# Patient Record
Sex: Female | Born: 1990 | Race: White | Hispanic: No | Marital: Married | Smoking: Never smoker
Health system: Southern US, Community
[De-identification: ages and names within clinical notes are randomized; demographics above are authoritative.]

## PROBLEM LIST (undated history)

## (undated) DIAGNOSIS — F419 Anxiety disorder, unspecified: Secondary | ICD-10-CM

## (undated) DIAGNOSIS — K5010 Crohn's disease of large intestine without complications: Secondary | ICD-10-CM

## (undated) DIAGNOSIS — K603 Anal fistula, unspecified: Secondary | ICD-10-CM

## (undated) DIAGNOSIS — K601 Chronic anal fissure: Secondary | ICD-10-CM

## (undated) DIAGNOSIS — Z973 Presence of spectacles and contact lenses: Secondary | ICD-10-CM

## (undated) HISTORY — PX: COLONOSCOPY: SHX174

## (undated) HISTORY — PX: WISDOM TOOTH EXTRACTION: SHX21

## (undated) HISTORY — PX: ANAL FISSURE REPAIR: SHX2312

## (undated) HISTORY — DX: Crohn's disease of large intestine without complications: K50.10

## (undated) HISTORY — DX: Anal fistula: K60.3

## (undated) HISTORY — DX: Anal fistula, unspecified: K60.30

---
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
## 2008-04-19 ENCOUNTER — Encounter: Admission: RE | Admit: 2008-04-19 | Discharge: 2008-04-19 | Payer: Self-pay | Admitting: Obstetrics and Gynecology

## 2008-10-16 ENCOUNTER — Encounter: Admission: RE | Admit: 2008-10-16 | Discharge: 2008-10-16 | Payer: Self-pay | Admitting: Obstetrics and Gynecology

## 2009-04-10 ENCOUNTER — Encounter: Admission: RE | Admit: 2009-04-10 | Discharge: 2009-04-10 | Payer: Self-pay | Admitting: Obstetrics and Gynecology

## 2009-10-03 ENCOUNTER — Encounter: Admission: RE | Admit: 2009-10-03 | Discharge: 2009-10-03 | Payer: Self-pay | Admitting: Obstetrics and Gynecology

## 2009-11-16 ENCOUNTER — Other Ambulatory Visit: Admission: RE | Admit: 2009-11-16 | Discharge: 2009-11-16 | Payer: Self-pay | Admitting: General Surgery

## 2010-05-19 IMAGING — US US BREAST BILAT
1 series · 13 of 25 positions shown · non-contrast
Comparison: 04/10/2009;

CLINICAL DATA: Follow-up of multiple fibroadenomas, one on the
left and two on the right

BILATERAL BREAST ULTRASOUND

[Series 1: us breast bilat · 13 of 30 slices shown]
[im 1/30]
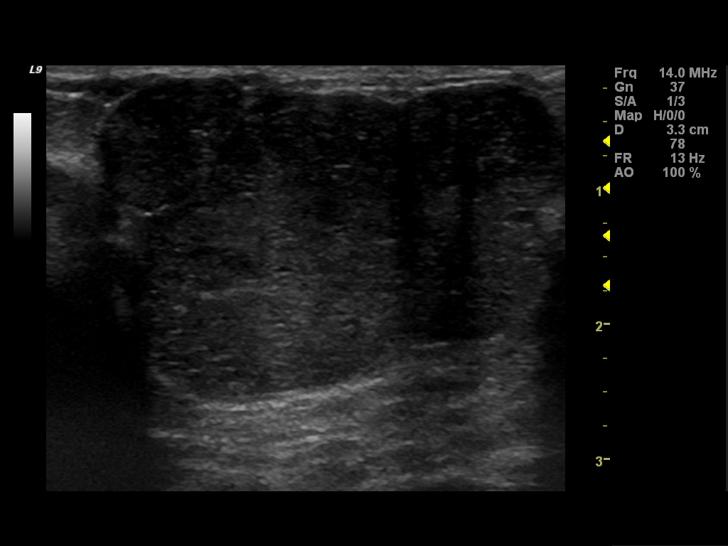
[im 3/30]
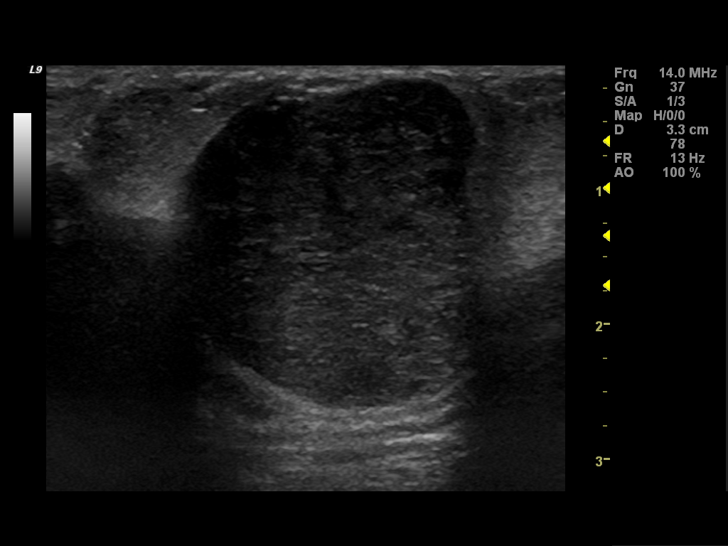
[im 5/30]
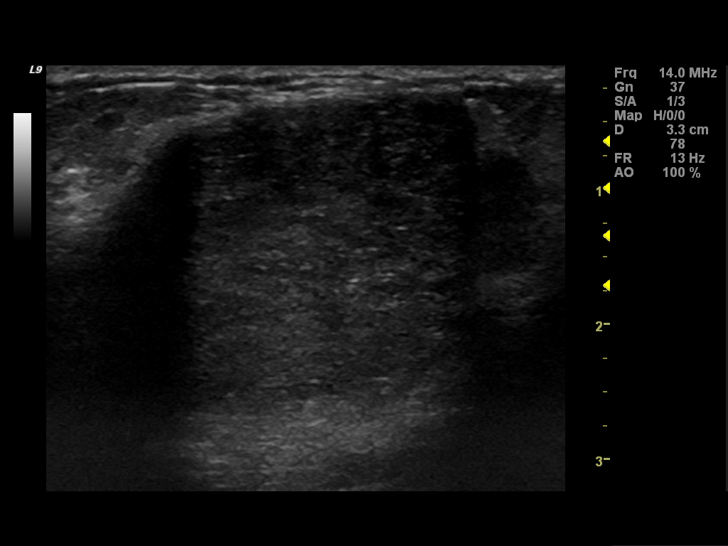
[im 8/30]
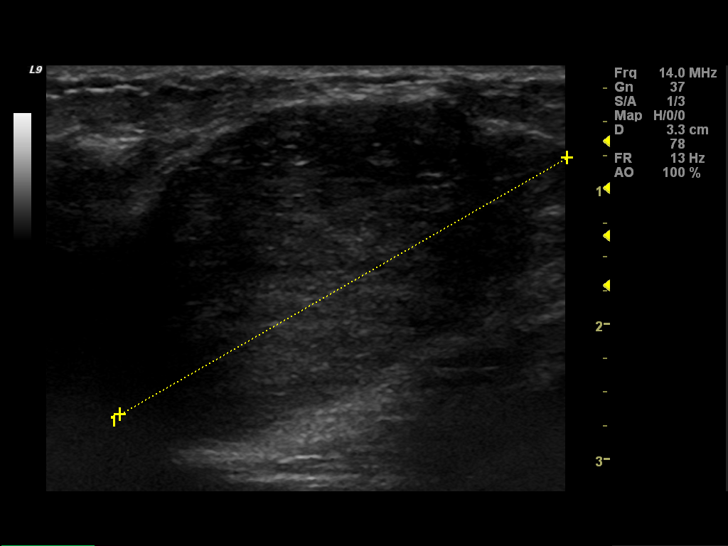
[im 10/30]
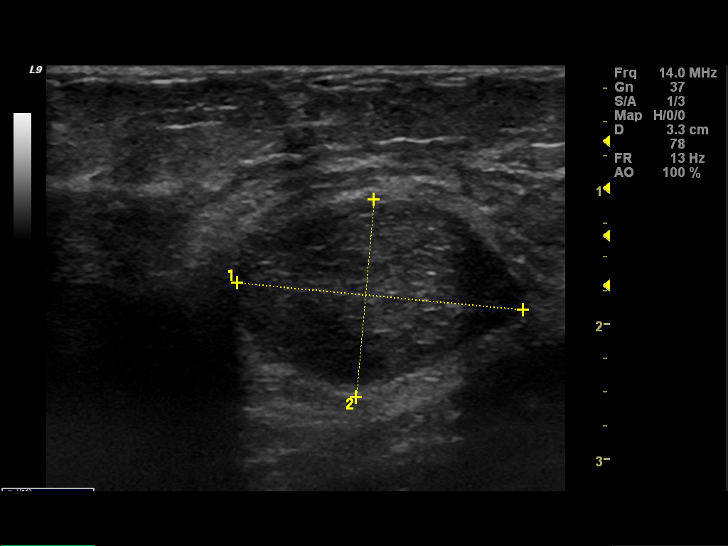
[im 13/30]
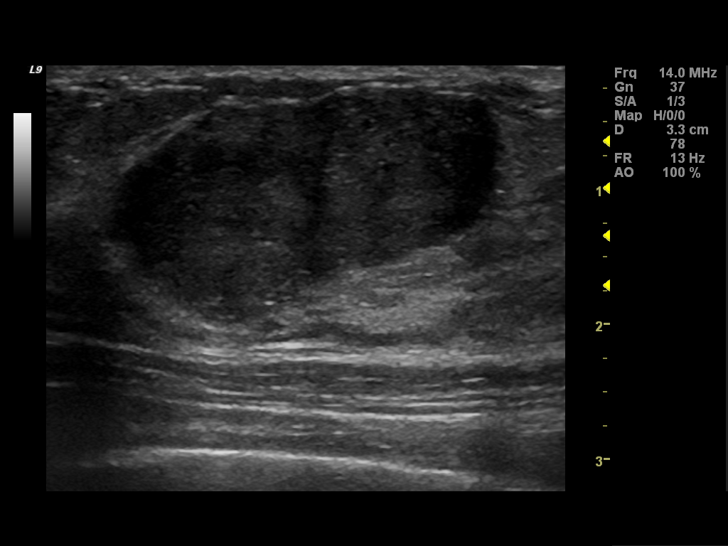
[im 15/30]
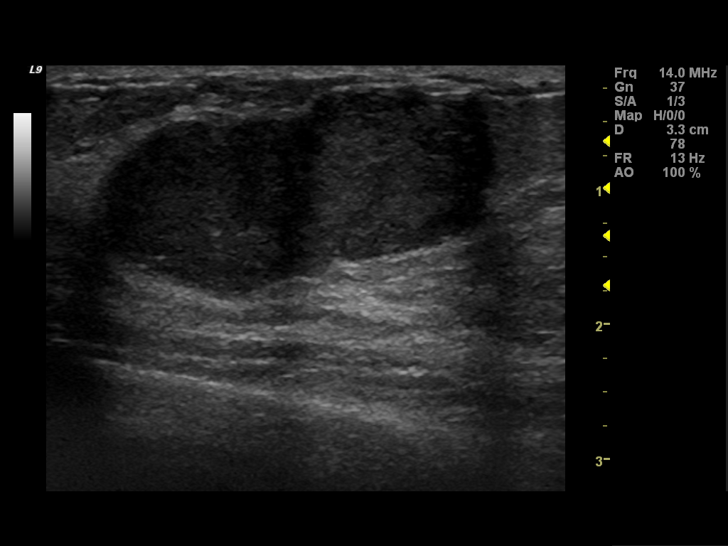
[im 17/30]
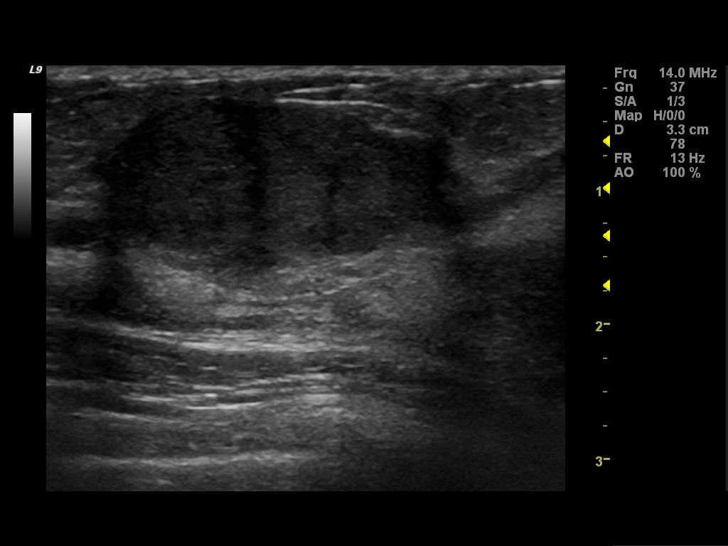
[im 20/30]
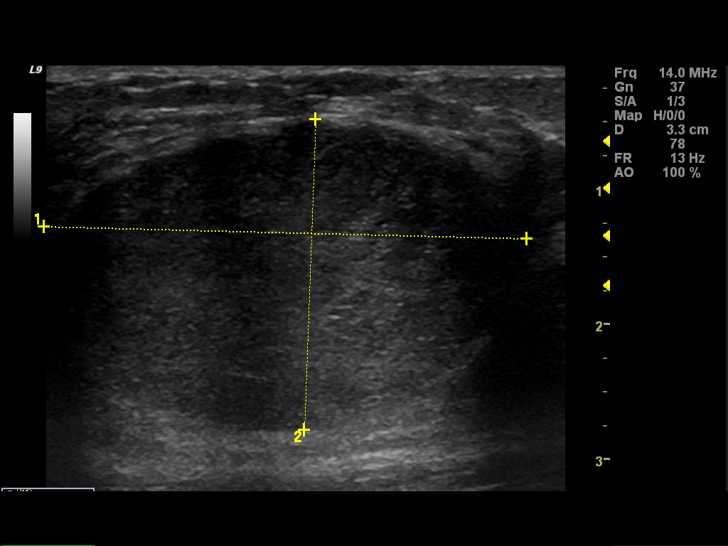
[im 22/30]
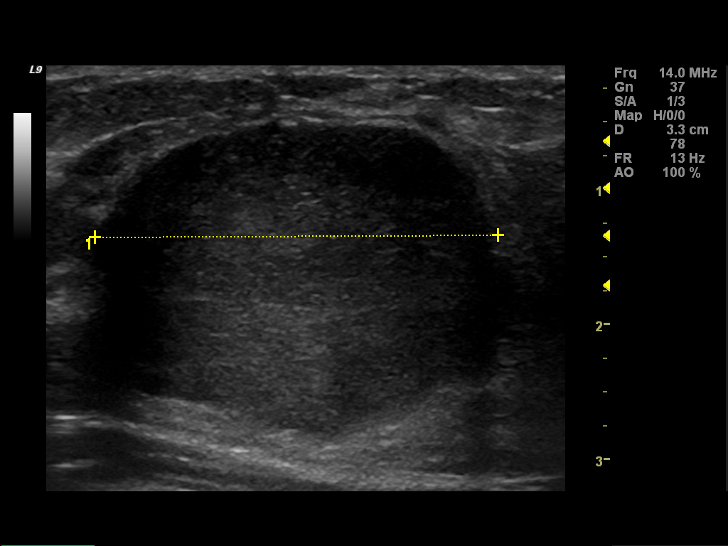
[im 25/30]
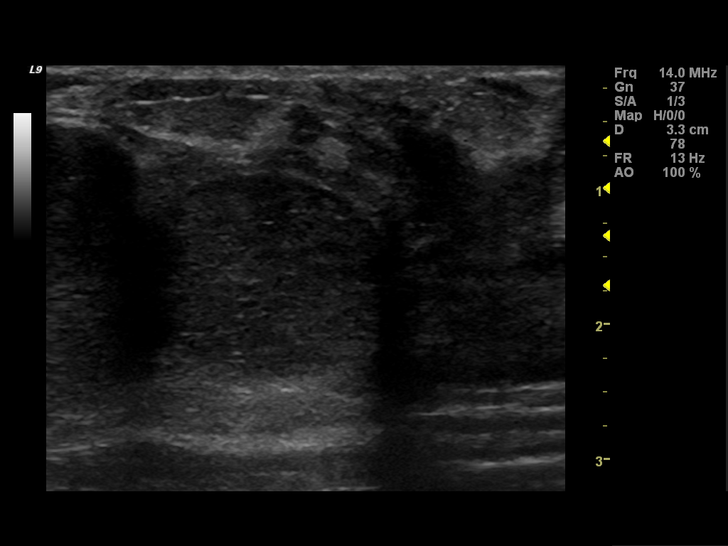
[im 27/30]
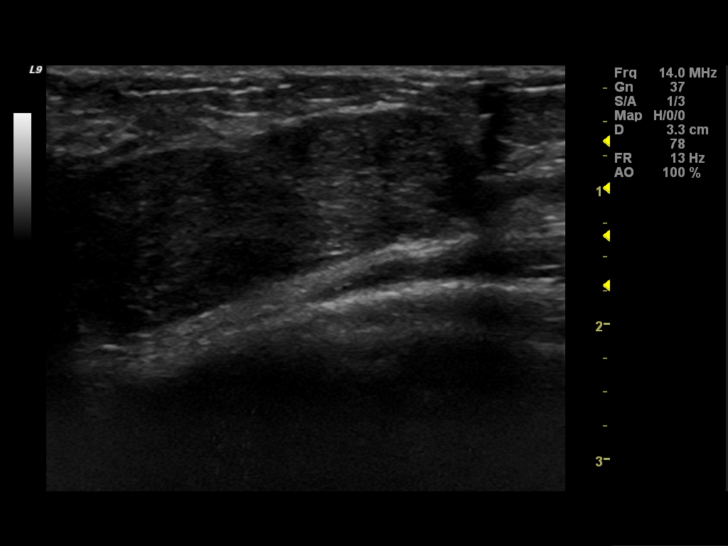
[im 30/30]
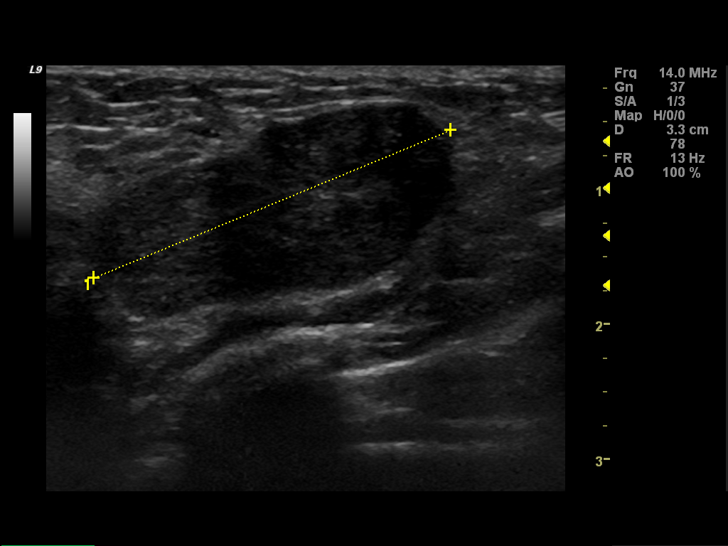

[13 of 25 positions shown; findings below may reference images not displayed]

10/16/2008; 04/19/2008; 10/29/2007

On physical exam, all of the fibroadenomas which will be described
below are palpable and mobile.
FINDINGS: Ultrasound is performed, showing numerous oval
circumscribed masses bilaterally, some of which are new, and some
of which have been previously described.  Those which have been
previously described have again enlarged to varying degrees, as
they did in the interval between the last ultrasound study and the
study immediately prior to that.  The comparisons described in
further detail below are between measurements obtained on today's
study and those obtained during the most recent comparison study on
04/10/09.

On the left, there are three new masses indicated by the patient,
as follows:

[DATE], 7cm from the nipple:  31 x 14 x 26mm

[DATE], 4cm from the nipple:  18 x 14 x 16mm

[DATE], 7cm from the nipple:   32 x 11 x 29mm

In the left [DATE] position, 5cm from the nipple, the mass has
increased from 26 x 20 x 29mm to 36 x 23 x 30mm

On the right, there is a new mass in the [DATE] position, 4cm from
the nipple, measuring 21 x 15 x 33mm.

In the right [DATE] position 5cm from the nipple, the mass has
mildly enlarged from 24 x 19 x 33mm to 22 x 25 x 35mm.  In the
right [DATE] position, 6cm from the nipple, the previously biopsy-
proven fibroadenoma has enlarged from 33x 20 x 29mm to 40 x 23 x
38mm.
IMPRESSION: Numerous bilateral masses, 7 in total, 4 of which are new, as
indicated above.  The remaining 3 masses, one of which has been
biopsied and shown to represent a fibroadenoma, have all again
enlarged, particularly the previously biopsied mass.

BI-RADS CATEGORY 4:  Suspicious abnormality - biopsy should be
considered.

RECOMMENDATION:

The three masses which have again enlarged should be surgically
excised.  The four new masses should be followed by ultrasound in
six months.  The patient left the [REDACTED] today with an
appointment to see Dr. Burchell on [DATE] at 4pm.

## 2010-06-18 ENCOUNTER — Encounter
Admission: RE | Admit: 2010-06-18 | Discharge: 2010-06-18 | Payer: Self-pay | Source: Home / Self Care | Attending: Obstetrics and Gynecology | Admitting: Obstetrics and Gynecology

## 2010-06-23 ENCOUNTER — Encounter (HOSPITAL_COMMUNITY): Payer: Self-pay | Admitting: Obstetrics and Gynecology

## 2010-06-24 ENCOUNTER — Encounter (HOSPITAL_COMMUNITY): Payer: Self-pay | Admitting: Obstetrics and Gynecology

## 2012-06-16 ENCOUNTER — Other Ambulatory Visit: Payer: Self-pay | Admitting: Obstetrics and Gynecology

## 2012-06-16 DIAGNOSIS — D249 Benign neoplasm of unspecified breast: Secondary | ICD-10-CM

## 2012-06-18 ENCOUNTER — Other Ambulatory Visit: Payer: Self-pay

## 2012-06-22 ENCOUNTER — Ambulatory Visit
Admission: RE | Admit: 2012-06-22 | Discharge: 2012-06-22 | Disposition: A | Discharge status: Home / Self Care | Payer: 59 | Source: Ambulatory Visit | Attending: Obstetrics and Gynecology | Admitting: Obstetrics and Gynecology

## 2012-06-22 DIAGNOSIS — D249 Benign neoplasm of unspecified breast: Secondary | ICD-10-CM

## 2012-11-24 ENCOUNTER — Other Ambulatory Visit: Payer: Self-pay | Admitting: Obstetrics and Gynecology

## 2012-11-24 DIAGNOSIS — D249 Benign neoplasm of unspecified breast: Secondary | ICD-10-CM

## 2012-12-24 ENCOUNTER — Ambulatory Visit
Admission: RE | Admit: 2012-12-24 | Discharge: 2012-12-24 | Disposition: A | Discharge status: Home / Self Care | Payer: 59 | Source: Ambulatory Visit | Attending: Obstetrics and Gynecology | Admitting: Obstetrics and Gynecology

## 2012-12-24 DIAGNOSIS — D249 Benign neoplasm of unspecified breast: Secondary | ICD-10-CM

## 2013-08-19 ENCOUNTER — Other Ambulatory Visit: Payer: Self-pay | Admitting: Obstetrics and Gynecology

## 2013-08-19 ENCOUNTER — Ambulatory Visit
Admission: RE | Admit: 2013-08-19 | Discharge: 2013-08-19 | Disposition: A | Discharge status: Home / Self Care | Payer: 59 | Source: Ambulatory Visit | Attending: Obstetrics and Gynecology | Admitting: Obstetrics and Gynecology

## 2013-08-19 DIAGNOSIS — N6323 Unspecified lump in the left breast, lower outer quadrant: Secondary | ICD-10-CM

## 2013-08-19 DIAGNOSIS — N6320 Unspecified lump in the left breast, unspecified quadrant: Principal | ICD-10-CM

## 2013-08-19 DIAGNOSIS — N6325 Unspecified lump in the left breast, overlapping quadrants: Secondary | ICD-10-CM

## 2013-09-14 ENCOUNTER — Ambulatory Visit
Admission: RE | Admit: 2013-09-14 | Discharge: 2013-09-14 | Disposition: A | Discharge status: Home / Self Care | Payer: 59 | Source: Ambulatory Visit | Attending: Obstetrics and Gynecology | Admitting: Obstetrics and Gynecology

## 2013-09-14 ENCOUNTER — Other Ambulatory Visit: Payer: Self-pay | Admitting: Obstetrics and Gynecology

## 2013-09-14 DIAGNOSIS — N6320 Unspecified lump in the left breast, unspecified quadrant: Secondary | ICD-10-CM

## 2013-09-30 ENCOUNTER — Ambulatory Visit (INDEPENDENT_AMBULATORY_CARE_PROVIDER_SITE_OTHER): Payer: 59 | Admitting: Surgery

## 2016-06-13 ENCOUNTER — Other Ambulatory Visit: Payer: Self-pay

## 2016-06-13 DIAGNOSIS — D229 Melanocytic nevi, unspecified: Secondary | ICD-10-CM

## 2016-06-13 HISTORY — DX: Melanocytic nevi, unspecified: D22.9

## 2017-01-08 ENCOUNTER — Other Ambulatory Visit: Payer: Self-pay | Admitting: Gastroenterology

## 2017-01-08 DIAGNOSIS — R748 Abnormal levels of other serum enzymes: Secondary | ICD-10-CM

## 2017-01-12 ENCOUNTER — Other Ambulatory Visit: Payer: Self-pay

## 2017-01-12 ENCOUNTER — Ambulatory Visit
Admission: RE | Admit: 2017-01-12 | Discharge: 2017-01-12 | Disposition: A | Discharge status: Home / Self Care | Payer: Managed Care, Other (non HMO) | Source: Ambulatory Visit | Attending: Gastroenterology | Admitting: Gastroenterology

## 2017-01-12 DIAGNOSIS — R748 Abnormal levels of other serum enzymes: Secondary | ICD-10-CM

## 2017-01-12 MED ORDER — IOPAMIDOL (ISOVUE-300) INJECTION 61%
100.0000 mL | Freq: Once | INTRAVENOUS | Status: AC | PRN
  Administered 2017-01-12: 100 mL via INTRAVENOUS

## 2017-01-13 ENCOUNTER — Other Ambulatory Visit: Payer: Self-pay

## 2019-06-03 DIAGNOSIS — U071 COVID-19: Secondary | ICD-10-CM

## 2019-06-03 HISTORY — DX: COVID-19: U07.1

## 2019-08-23 ENCOUNTER — Telehealth: Payer: Self-pay

## 2019-08-23 NOTE — Telephone Encounter (Signed)
Phone call to patient per [facility] request to schedule patient a follow up appointment.  Voicemail left for patient to return our call.

## 2019-08-25 ENCOUNTER — Telehealth: Payer: Self-pay | Admitting: Physician Assistant

## 2019-08-25 NOTE — Telephone Encounter (Signed)
Patient left message on voicemail last night.  She said that she was returning our phone call.  Chart # [id number]

## 2019-08-25 NOTE — Telephone Encounter (Signed)
Left message for patient to call the office and schedule an appointment for recheck, per KRS.

## 2019-08-29 NOTE — Telephone Encounter (Signed)
Patient returned call. I informed her that nurse left a message for her to call back and schedule a follow up with [facility]. Pt states that the lesion on her nose is getting better now and she does not feel that she needs follow up at this time. She will call back PRN.

## 2020-04-05 ENCOUNTER — Ambulatory Visit: Payer: Self-pay | Admitting: Surgery

## 2020-04-05 NOTE — H&P (Signed)
CC: Referred for possible anal fissure/pain  HPI: Ms. Melissa Nicholson is a very pleasant 84yoF with hx of anxiety who was referred to [place] by [facility] physicians for evaluation of possible anal fissure. She reports beginning in May, she began having a sharp knifelike sensation when she had bowel movements. This will resolve after a few minutes following the bowel movement. She saw her primary care and was prescribed a compound topical ointment that she is unsure of name. She reports she was told to apply this for 4 days. She applied for approximately a week and began to notice some improvement but then stopped. Back in May she did have some drops of bright red blood when she had a bowel movement. That has since resolved. She is not taking a fiber supplement. She reports that she has 1 bowel movement per day and it varies in consistency from soft to hard. She drinks less than 64 ounces of water. She spends approximately 5 minutes on the commode when she has a bowel movement.  INTERVAL HX She has been applying the topical nifedipine gel 4 times daily as prescribed. She noticed that things began to heal and would reopen. She is taking fiber twice a day. She is drinking 64 ounces of water per day. She is having soft easy to pass bowel movements without any difficulty. Despite this, continues to have knifelike sensations with bowel movements.  She denies any issues with incontinence to gas, liquid or solid stool.  No prior vaginal deliveries or hx of pelvic trauma; is interested in having children in the future though  PMH: Anxiety (well controlled with escitalopram)  PSH: Denies any prior anorectal procedures or surgeries  FHx: Paternal grandmother had colon cancer; maternal aunt had breast cancer. She denies any other family histories of malignancy. She denies any known family history of IBD, Crohn's, ulcerative colitis.  Social: Denies use of tobacco; social EtOH use; denies illicit drug use. 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  The patient is a 29 year old female.   Allergies Melissa Nicholson, Melissa Nicholson; 03/21/2020 3:54 PM) No Known Drug Allergies  [01/24/2020]: Allergies Reconciled   Medication History Melissa Nicholson, Melissa Nicholson; 03/21/2020 3:54 PM) Citalopram Hydrobromide (20MG  Tablet, Oral) Active. Lo Loestrin Fe (1 MG-10 MCG /10 MCG Tablet, Oral) Active. Medications Reconciled    Review of Systems Melissa Nicholson; 03/21/2020 4:03 PM) General Not Present- Appetite Loss, Chills, Fatigue, Fever, Night Sweats, Weight Gain and Weight Loss. Skin Not Present- Change in Wart/Mole, Dryness, Hives, Jaundice, New Lesions, Non-Healing Wounds, Rash and Ulcer. Respiratory Not Present- Bloody sputum, Chronic Cough, Difficulty Breathing, Snoring and Wheezing. Breast Not Present- Breast Mass, Breast Pain, Nipple Discharge and Skin Changes. Cardiovascular Not Present- Chest Pain, Difficulty Breathing Lying Down, Leg Cramps, Palpitations, Rapid Heart Rate, Shortness of Breath and Swelling of Extremities. Gastrointestinal Present- Bloody Stool (Previously, now resolved), Hemorrhoids and Rectal Pain. Not Present- Abdominal Pain, Bloating, Change in Bowel Habits, Chronic diarrhea, Constipation, Difficulty Swallowing, Excessive gas, Gets full quickly at meals, Indigestion, Nausea and Vomiting. Female Genitourinary Not Present- Frequency, Nocturia, Painful Urination, Pelvic Pain and Urgency. Musculoskeletal Not Present- Back Pain, Joint Pain, Joint Stiffness, Muscle Pain, Muscle Weakness and Swelling of Extremities. Neurological Not Present- Decreased Memory, Fainting, Headaches, Numbness, Seizures, Tingling, Tremor, Trouble walking and Weakness. Psychiatric Not Present- Anxiety, Bipolar, Change in Sleep Pattern, Depression, Fearful and Frequent crying. Endocrine Not Present- Cold  Intolerance, Excessive Hunger, Hair Changes, Heat Intolerance, Hot flashes and New Diabetes. Hematology Not Present- Blood Clots and Blood Thinners.  Vitals Melissa Nicholson; 03/21/2020 3:54 PM) 03/21/2020 3:54 PM Weight: 139.13 lb Height: 64in Body Surface Area: 1.68 m Body Mass Index: 23.88 kg/m  Temp.: 98.81F  Pulse: 87 (Regular)  P.OX: 98% (Room air) BP: 120/66(Sitting, Left Arm, Standard)       Physical Exam Melissa Nicholson; 03/21/2020 4:03 PM) The physical exam findings are as follows: Note: Constitutional: No acute distress; conversant; no deformities; wearing mask Eyes: Moist conjunctiva; no lid lag; anicteric sclerae; pupils equal and round Lungs: Normal respiratory effort CV: rrr; no pitting edema GI: Abdomen soft, nontender, nondistended; no palpable hepatosplenomegaly Anorectal: Midline shallow anal fissure persists; no tags/ext hemorrhoids; DRE/Anoscopy deferred due to anal fissure today MSK: Normal gait; no clubbing/cyanosis Psychiatric: Appropriate affect; alert and oriented 3  **A chaperone, Melissa Nicholson, was present for this encounter    Assessment & Plan Melissa Nicholson; 03/21/2020 4:21 PM) ANAL FISSURE (K60.2) Story: Ms. Melissa Nicholson is a very pleasant 38yoF with anal fissure - persists despite maximal medical management and topical CCBs Impression: -The anatomy and physiology of the anal canal was discussed at length with the patient. The pathophysiology of anal fissures has been discussed at length with associated pictures and illustrations. -We have reviewed options going forward - including further treatment with CCBs although unlikely to remedy given her now 2 month trial without improvement; injection of anal Botox and lateral internal sphincterotomy -She is interested in anal Botox which I think is a reasonable choice for her - particularly given her plans for children in the future -The planned procedure, material risks  (including, but not limited to, pain, bleeding, infection, scarring, need for blood transfusion, damage to anal sphincter, incontinence of gas and/or stool, need for additional procedures, recurrence, pneumonia, heart attack, stroke, death) benefits and alternatives to surgery were discussed at length. I noted a good probability that the procedure would help improve their symptoms. The patient's questions were answered to their satisfaction, they voiced understanding and elected to proceed with surgery. Additionally, we discussed typical postoperative expectations and the recovery process.  This patient encounter took 18 minutes today to perform the following: take history, perform exam, review outside records, interpret imaging, counsel the patient on their diagnosis and document encounter, findings & plan in the EHR  Signed by Melissa Roup, Nicholson (03/21/2020 4:21 PM)

## 2020-06-18 ENCOUNTER — Other Ambulatory Visit (HOSPITAL_COMMUNITY): Payer: Managed Care, Other (non HMO)

## 2020-06-19 ENCOUNTER — Encounter (HOSPITAL_BASED_OUTPATIENT_CLINIC_OR_DEPARTMENT_OTHER): Payer: Self-pay | Admitting: Surgery

## 2020-06-19 ENCOUNTER — Other Ambulatory Visit (HOSPITAL_COMMUNITY)
Admission: RE | Admit: 2020-06-19 | Discharge: 2020-06-19 | Disposition: A | Discharge status: Home / Self Care | Payer: Managed Care, Other (non HMO) | Source: Ambulatory Visit | Attending: Surgery | Admitting: Surgery

## 2020-06-19 ENCOUNTER — Other Ambulatory Visit: Payer: Self-pay

## 2020-06-19 DIAGNOSIS — Z01812 Encounter for preprocedural laboratory examination: Secondary | ICD-10-CM | POA: Insufficient documentation

## 2020-06-19 DIAGNOSIS — Z20822 Contact with and (suspected) exposure to covid-19: Secondary | ICD-10-CM | POA: Diagnosis not present

## 2020-06-19 LAB — SARS CORONAVIRUS 2 (TAT 6-24 HRS): SARS Coronavirus 2: NEGATIVE

## 2020-06-19 NOTE — Progress Notes (Signed)
Spoke w/ via phone for pre-op interview---pt Lab needs dos----     Urine poct          Lab results------none COVID test ------06-19-2020 850 Arrive at -------530 am 06-21-2020 NPO after MN NO Solid Food.  Clear liquids from MN until---430 am then npo Medications to take morning of surgery -----escitalopram Diabetic medication -----n/a Patient Special Instructions -----none Pre-Op special Istructions -----none Patient verbalized understanding of instructions that were given at this phone interview. Patient denies shortness of breath, chest pain, fever, cough at this phone interview.

## 2020-06-20 NOTE — Anesthesia Preprocedure Evaluation (Signed)
Anesthesia Evaluation  Patient identified by MRN, date of birth, ID band Patient awake    Reviewed: Allergy & Precautions, H&P , NPO status , Patient's Chart, lab work & pertinent test results  Airway Mallampati: II  TM Distance: >3 FB Neck ROM: Full    Dental no notable dental hx. (+) Teeth Intact, Dental Advisory Given   Pulmonary neg pulmonary ROS,    Pulmonary exam normal breath sounds clear to auscultation       Cardiovascular Exercise Tolerance: Good negative cardio ROS   Rhythm:Regular Rate:Normal     Neuro/Psych Anxiety negative neurological ROS     GI/Hepatic negative GI ROS, Neg liver ROS,   Endo/Other  negative endocrine ROS  Renal/GU negative Renal ROS  negative genitourinary   Musculoskeletal   Abdominal   Peds  Hematology negative hematology ROS (+)   Anesthesia Other Findings   Reproductive/Obstetrics negative OB ROS                            Anesthesia Physical Anesthesia Plan  ASA: II  Anesthesia Plan: MAC   Post-op Pain Management:    Induction: Intravenous  PONV Risk Score and Plan: 3 and Ondansetron, Dexamethasone, Propofol infusion and Midazolam  Airway Management Planned: Simple Face Mask and Natural Airway  Additional Equipment:   Intra-op Plan:   Post-operative Plan:   Informed Consent: I have reviewed the patients History and Physical, chart, labs and discussed the procedure including the risks, benefits and alternatives for the proposed anesthesia with the patient or authorized representative who has indicated his/her understanding and acceptance.     Dental advisory given  Plan Discussed with: CRNA  Anesthesia Plan Comments:        Anesthesia Quick Evaluation

## 2020-06-21 ENCOUNTER — Other Ambulatory Visit: Payer: Self-pay

## 2020-06-21 ENCOUNTER — Encounter (HOSPITAL_BASED_OUTPATIENT_CLINIC_OR_DEPARTMENT_OTHER): Payer: Self-pay | Admitting: Surgery

## 2020-06-21 ENCOUNTER — Encounter (HOSPITAL_BASED_OUTPATIENT_CLINIC_OR_DEPARTMENT_OTHER)
Admission: RE | Disposition: A | Discharge status: Home / Self Care | Payer: Self-pay | Source: Home / Self Care | Attending: Surgery

## 2020-06-21 ENCOUNTER — Ambulatory Visit (HOSPITAL_BASED_OUTPATIENT_CLINIC_OR_DEPARTMENT_OTHER)
Admission: RE | Admit: 2020-06-21 | Discharge: 2020-06-21 | Disposition: A | Discharge status: Home / Self Care | Payer: Managed Care, Other (non HMO) | Attending: Surgery | Admitting: Surgery

## 2020-06-21 ENCOUNTER — Ambulatory Visit (HOSPITAL_BASED_OUTPATIENT_CLINIC_OR_DEPARTMENT_OTHER): Payer: Managed Care, Other (non HMO) | Admitting: Anesthesiology

## 2020-06-21 DIAGNOSIS — Z91013 Allergy to seafood: Secondary | ICD-10-CM | POA: Insufficient documentation

## 2020-06-21 DIAGNOSIS — F419 Anxiety disorder, unspecified: Secondary | ICD-10-CM | POA: Diagnosis not present

## 2020-06-21 DIAGNOSIS — K601 Chronic anal fissure: Secondary | ICD-10-CM | POA: Diagnosis not present

## 2020-06-21 DIAGNOSIS — K644 Residual hemorrhoidal skin tags: Secondary | ICD-10-CM | POA: Insufficient documentation

## 2020-06-21 HISTORY — PX: RECTAL EXAM UNDER ANESTHESIA: SHX6399

## 2020-06-21 HISTORY — DX: Chronic anal fissure: K60.1

## 2020-06-21 HISTORY — DX: Anxiety disorder, unspecified: F41.9

## 2020-06-21 HISTORY — DX: Presence of spectacles and contact lenses: Z97.3

## 2020-06-21 LAB — POCT PREGNANCY, URINE: Preg Test, Ur: NEGATIVE

## 2020-06-21 SURGERY — EXAM UNDER ANESTHESIA, RECTUM
Anesthesia: Monitor Anesthesia Care | Site: Rectum

## 2020-06-21 MED ORDER — LIDOCAINE HCL (CARDIAC) PF 100 MG/5ML IV SOSY
PREFILLED_SYRINGE | INTRAVENOUS | Status: DC | PRN
  Administered 2020-06-21: 40 mg via INTRAVENOUS

## 2020-06-21 MED ORDER — PROPOFOL 10 MG/ML IV BOLUS
INTRAVENOUS | Status: DC | PRN
  Administered 2020-06-21 (×3): 30 mg via INTRAVENOUS
  Administered 2020-06-21: 50 mg via INTRAVENOUS
  Administered 2020-06-21 (×3): 30 mg via INTRAVENOUS
  Administered 2020-06-21: 20 mg via INTRAVENOUS
  Administered 2020-06-21: 30 mg via INTRAVENOUS
  Administered 2020-06-21: 20 mg via INTRAVENOUS

## 2020-06-21 MED ORDER — BUPIVACAINE-EPINEPHRINE 0.25% -1:200000 IJ SOLN
INTRAMUSCULAR | Status: DC | PRN
  Administered 2020-06-21: 30 mL

## 2020-06-21 MED ORDER — PROPOFOL 10 MG/ML IV BOLUS
INTRAVENOUS | Status: AC
  Filled 2020-06-21: qty 40

## 2020-06-21 MED ORDER — DIBUCAINE (PERIANAL) 1 % EX OINT
TOPICAL_OINTMENT | CUTANEOUS | Status: DC | PRN
  Administered 2020-06-21: 1 via RECTAL

## 2020-06-21 MED ORDER — SODIUM CHLORIDE (PF) 0.9 % IJ SOLN
INTRAMUSCULAR | Status: DC | PRN
  Administered 2020-06-21: 3 mL

## 2020-06-21 MED ORDER — ONDANSETRON HCL 4 MG/2ML IJ SOLN: INTRAMUSCULAR | Status: DC | PRN

## 2020-06-21 MED ORDER — MIDAZOLAM HCL 5 MG/5ML IJ SOLN
INTRAMUSCULAR | Status: DC | PRN
  Administered 2020-06-21: 2 mg via INTRAVENOUS

## 2020-06-21 MED ORDER — ONABOTULINUMTOXINA 100 UNITS IJ SOLR
INTRAMUSCULAR | Status: DC | PRN
  Administered 2020-06-21: 100 [IU] via INTRAMUSCULAR

## 2020-06-21 MED ORDER — ACETAMINOPHEN 500 MG PO TABS
1000.0000 mg | ORAL_TABLET | ORAL | Status: AC
  Administered 2020-06-21: 1000 mg via ORAL

## 2020-06-21 MED ORDER — FENTANYL CITRATE (PF) 100 MCG/2ML IJ SOLN
INTRAMUSCULAR | Status: DC | PRN
  Administered 2020-06-21: 50 ug via INTRAVENOUS

## 2020-06-21 MED ORDER — FENTANYL CITRATE (PF) 100 MCG/2ML IJ SOLN
INTRAMUSCULAR | Status: AC
  Filled 2020-06-21: qty 2

## 2020-06-21 MED ORDER — CHLORHEXIDINE GLUCONATE CLOTH 2 % EX PADS: 6.0000 | MEDICATED_PAD | Freq: Once | CUTANEOUS | Status: DC

## 2020-06-21 MED ORDER — KETOROLAC TROMETHAMINE 30 MG/ML IJ SOLN
INTRAMUSCULAR | Status: DC | PRN
  Administered 2020-06-21: 30 mg via INTRAVENOUS

## 2020-06-21 MED ORDER — ACETAMINOPHEN 500 MG PO TABS
ORAL_TABLET | ORAL | Status: AC
  Filled 2020-06-21: qty 2

## 2020-06-21 MED ORDER — PROPOFOL 10 MG/ML IV BOLUS
INTRAVENOUS | Status: AC
  Filled 2020-06-21: qty 20

## 2020-06-21 MED ORDER — ONDANSETRON HCL 4 MG/2ML IJ SOLN
INTRAMUSCULAR | Status: DC | PRN
  Administered 2020-06-21: 4 mg via INTRAVENOUS

## 2020-06-21 MED ORDER — KETOROLAC TROMETHAMINE 30 MG/ML IJ SOLN
INTRAMUSCULAR | Status: AC
  Filled 2020-06-21: qty 1

## 2020-06-21 MED ORDER — BUPIVACAINE LIPOSOME 1.3 % IJ SUSP
INTRAMUSCULAR | Status: DC | PRN
  Administered 2020-06-21: 20 mL

## 2020-06-21 MED ORDER — BUPIVACAINE LIPOSOME 1.3 % IJ SUSP: 20.0000 mL | Freq: Once | INTRAMUSCULAR | Status: DC

## 2020-06-21 MED ORDER — MIDAZOLAM HCL 2 MG/2ML IJ SOLN
INTRAMUSCULAR | Status: AC
  Filled 2020-06-21: qty 2

## 2020-06-21 MED ORDER — DEXMEDETOMIDINE (PRECEDEX) IN NS 20 MCG/5ML (4 MCG/ML) IV SYRINGE
PREFILLED_SYRINGE | INTRAVENOUS | Status: AC
  Filled 2020-06-21: qty 5

## 2020-06-21 MED ORDER — DEXMEDETOMIDINE (PRECEDEX) IN NS 20 MCG/5ML (4 MCG/ML) IV SYRINGE
PREFILLED_SYRINGE | INTRAVENOUS | Status: DC | PRN
  Administered 2020-06-21 (×2): 4 ug via INTRAVENOUS

## 2020-06-21 MED ORDER — LACTATED RINGERS IV SOLN: INTRAVENOUS | Status: DC

## 2020-06-21 MED ORDER — FENTANYL CITRATE (PF) 100 MCG/2ML IJ SOLN: 25.0000 ug | INTRAMUSCULAR | Status: DC | PRN

## 2020-06-21 MED ORDER — TRAMADOL HCL 50 MG PO TABS: 50.0000 mg | ORAL_TABLET | Freq: Four times a day (QID) | ORAL | 0 refills | Status: AC | PRN

## 2020-06-21 SURGICAL SUPPLY — 66 items
APL SKNCLS STERI-STRIP NONHPOA (GAUZE/BANDAGES/DRESSINGS)
BENZOIN TINCTURE PRP APPL 2/3 (GAUZE/BANDAGES/DRESSINGS) IMPLANT
BLADE EXTENDED COATED 6.5IN (ELECTRODE) IMPLANT
BLADE HEX COATED 2.75 (ELECTRODE) IMPLANT
BLADE SURG 10 STRL SS (BLADE) IMPLANT
BLADE SURG 15 STRL LF DISP TIS (BLADE) IMPLANT
BLADE SURG 15 STRL SS (BLADE)
BRIEF STRETCH FOR OB PAD LRG (UNDERPADS AND DIAPERS) ×2 IMPLANT
CANISTER SUCT 3000ML PPV (MISCELLANEOUS) IMPLANT
COVER BACK TABLE 60X90IN (DRAPES) ×2 IMPLANT
COVER MAYO STAND STRL (DRAPES) IMPLANT
COVER WAND RF STERILE (DRAPES) IMPLANT
DECANTER SPIKE VIAL GLASS SM (MISCELLANEOUS) ×2 IMPLANT
DRAPE HYSTEROSCOPY (MISCELLANEOUS) ×2 IMPLANT
DRAPE LAPAROTOMY 100X72 PEDS (DRAPES) IMPLANT
DRAPE SHEET LG 3/4 BI-LAMINATE (DRAPES) ×2 IMPLANT
DRAPE UTILITY XL STRL (DRAPES) IMPLANT
DRSG PAD ABDOMINAL 8X10 ST (GAUZE/BANDAGES/DRESSINGS) ×2 IMPLANT
GAUZE SPONGE 4X4 12PLY STRL (GAUZE/BANDAGES/DRESSINGS) ×2 IMPLANT
GAUZE SPONGE 4X4 12PLY STRL LF (GAUZE/BANDAGES/DRESSINGS) ×2 IMPLANT
GLOVE BIO SURGEON STRL SZ7.5 (GLOVE) ×2 IMPLANT
GLOVE INDICATOR 8.0 STRL GRN (GLOVE) ×2 IMPLANT
GOWN STRL REUS W/ TWL XL LVL3 (GOWN DISPOSABLE) ×1 IMPLANT
GOWN STRL REUS W/TWL XL LVL3 (GOWN DISPOSABLE) ×2
HYDROGEN PEROXIDE 16OZ (MISCELLANEOUS) IMPLANT
IV CATH 14GX2 1/4 (CATHETERS) IMPLANT
IV CATH 18G SAFETY (IV SOLUTION) IMPLANT
KIT SIGMOIDOSCOPE (SET/KITS/TRAYS/PACK) IMPLANT
KIT TURNOVER CYSTO (KITS) ×2 IMPLANT
LEGGING LITHOTOMY PAIR STRL (DRAPES) ×2 IMPLANT
LOOP VESSEL MAXI BLUE (MISCELLANEOUS) IMPLANT
NDL SAFETY ECLIPSE 18X1.5 (NEEDLE) ×1 IMPLANT
NEEDLE HYPO 18GX1.5 SHARP (NEEDLE) ×2
NEEDLE HYPO 22GX1.5 SAFETY (NEEDLE) ×2 IMPLANT
NEEDLE HYPO 25X1 1.5 SAFETY (NEEDLE) ×2 IMPLANT
NS IRRIG 500ML POUR BTL (IV SOLUTION) ×2 IMPLANT
PACK BASIN DAY SURGERY FS (CUSTOM PROCEDURE TRAY) ×2 IMPLANT
PENCIL SMOKE EVACUATOR (MISCELLANEOUS) ×2 IMPLANT
SPONGE HEMORRHOID 8X3CM (HEMOSTASIS) IMPLANT
SPONGE SURGIFOAM ABS GEL 12-7 (HEMOSTASIS) IMPLANT
SUCTION FRAZIER HANDLE 10FR (MISCELLANEOUS)
SUCTION TUBE FRAZIER 10FR DISP (MISCELLANEOUS) IMPLANT
SUT CHROMIC 2 0 SH (SUTURE) IMPLANT
SUT CHROMIC 3 0 SH 27 (SUTURE) IMPLANT
SUT MNCRL AB 4-0 PS2 18 (SUTURE) IMPLANT
SUT SILK 0 PSL NDL (SUTURE) IMPLANT
SUT SILK 0 TIES 10X30 (SUTURE) IMPLANT
SUT SILK 2 0 (SUTURE)
SUT SILK 2-0 18XBRD TIE 12 (SUTURE) IMPLANT
SUT VIC AB 2-0 SH 27 (SUTURE)
SUT VIC AB 2-0 SH 27XBRD (SUTURE) IMPLANT
SUT VIC AB 3-0 SH 18 (SUTURE) IMPLANT
SUT VIC AB 3-0 SH 27 (SUTURE)
SUT VIC AB 3-0 SH 27X BRD (SUTURE) IMPLANT
SUT VIC AB 3-0 SH 27XBRD (SUTURE) IMPLANT
SUT VIC AB 4-0 P-3 18XBRD (SUTURE) IMPLANT
SUT VIC AB 4-0 P3 18 (SUTURE)
SYR 20ML LL LF (SYRINGE) IMPLANT
SYR 5ML LL (SYRINGE) ×2 IMPLANT
SYR BULB IRRIG 60ML STRL (SYRINGE) IMPLANT
SYR CONTROL 10ML LL (SYRINGE) ×2 IMPLANT
SYR TB 1ML LL NO SAFETY (SYRINGE) ×2 IMPLANT
TOWEL OR 17X26 10 PK STRL BLUE (TOWEL DISPOSABLE) ×2 IMPLANT
TRAY DSU PREP LF (CUSTOM PROCEDURE TRAY) ×2 IMPLANT
TUBE CONNECTING 12X1/4 (SUCTIONS) IMPLANT
YANKAUER SUCT BULB TIP NO VENT (SUCTIONS) IMPLANT

## 2020-06-21 NOTE — Transfer of Care (Signed)
Immediate Anesthesia Transfer of Care Note  Patient: Melissa Nicholson  Procedure(s) Performed: ANORECTAL EXAM UNDER ANESTHESIA. INJECTION OF ANAL BOTOX (N/A Rectum)  Patient Location: PACU  Anesthesia Type:MAC  Level of Consciousness: drowsy  Airway & Oxygen Therapy: Patient Spontanous Breathing and Patient connected to face mask oxygen  Post-op Assessment: Report given to RN and Post -op Vital signs reviewed and stable  Post vital signs: Reviewed and stable  Last Vitals:  Vitals Value Taken Time  BP 92/50 06/21/20 0815  Temp 36.4 C 06/21/20 0811  Pulse 68 06/21/20 0816  Resp 15 06/21/20 0816  SpO2 97 % 06/21/20 0816  Vitals shown include unvalidated device data.  Last Pain:  Vitals:   06/21/20 0540  TempSrc: Oral         Complications: No complications documented.

## 2020-06-21 NOTE — H&P (Signed)
CC: Here today for surgery  HPI: Ms. Duff is a very pleasant 41yoF with hx of anxiety who was referred to [place] by [facility] physicians for evaluation of possible anal fissure. She reports beginning in May, she began having a sharp knifelike sensation when she had bowel movements. This will resolve after a few minutes following the bowel movement. She saw her primary care and was prescribed a compound topical ointment that she is unsure of name. She reports she was told to apply this for 4 days. She applied for approximately a week and began to notice some improvement but then stopped. Back in May she did have some drops of bright red blood when she had a bowel movement. That has since resolved. She is not taking a fiber supplement. She reports that she has 1 bowel movement per day and it varies in consistency from soft to hard. She drinks less than 64 ounces of water. She spends approximately 5 minutes on the commode when she has a bowel movement.  She had been applying the topical nifedipine gel 4 times daily as prescribed. She noticed that things began to heal and would reopen. She is taking fiber twice a day. She is drinking 64 ounces of water per day. She is having soft easy to pass bowel movements without any difficulty. Despite this, continues to have knifelike sensations with bowel movements.  She denies any issues with incontinence to gas, liquid or solid stool.  No prior vaginal deliveries or hx of pelvic trauma; is interested in having children in the future though  INTERVAL HX She denies any changes in her health or health history since we met in the office. Denies any new medications, allergies or issues.   PMH: Anxiety (well controlled with escitalopram)  PSH: Denies any prior anorectal procedures or surgeries  FHx: Paternal grandmother had colon cancer; maternal aunt had breast cancer. She denies any other family histories of malignancy. She denies any known  family history of IBD, Crohn's, ulcerative colitis.  Social: Denies use of tobacco; social EtOH use; denies illicit drug use. 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  Past Medical History:  Diagnosis Date  . Anxiety   . Chronic anal fissure   . COVID 06/2019   sob, chest pain loss of taste and smell fever coughx 1 week all symptoms resolved  . Wears glasses     Past Surgical History:  Procedure Laterality Date  . lumps removed from both breasts Bilateral 2011   fibrous adenomas benign  . WISDOM TOOTH EXTRACTION      History reviewed. No pertinent family history.  Social:  reports that she has never smoked. She has never used smokeless tobacco. She reports current alcohol use. She reports previous drug use.  Allergies:  Allergies  Allergen Reactions  . Crab (Diagnostic)     Swelling of eyes    Medications: I have reviewed the patient's current medications.  Results for orders placed or performed during the hospital encounter of 06/21/20 (from the past 48 hour(s))  Pregnancy, urine POC     Status: None   Collection Time: 06/21/20  5:38 AM  Result Value Ref Range   Preg Test, Ur NEGATIVE NEGATIVE    Comment:        THE SENSITIVITY OF THIS METHODOLOGY IS >24 mIU/mL     No results found.  ROS - all of the below systems  have been reviewed with the patient and positives are indicated with bold text General: chills, fever or night sweats Eyes: blurry vision or double vision ENT: epistaxis or sore throat Allergy/Immunology: itchy/watery eyes or nasal congestion Hematologic/Lymphatic: bleeding problems, blood clots or swollen lymph nodes Endocrine: temperature intolerance or unexpected weight changes Breast: new or changing breast lumps or nipple discharge Resp: cough, shortness of breath, or wheezing CV: chest pain or dyspnea on exertion GI: as per  HPI GU: dysuria, trouble voiding, or hematuria MSK: joint pain or joint stiffness Neuro: TIA or stroke symptoms Derm: pruritus and skin lesion changes Psych: anxiety and depression  PE Blood pressure 123/80, pulse 81, temperature 97.6 F (36.4 C), temperature source Oral, resp. rate 14, height 5' 5"  (1.651 m), weight 61.6 kg, SpO2 100 %. Constitutional: NAD; conversant; wearing mask Eyes: Moist conjunctiva; no lid lag; anicteric Lungs: Normal respiratory effort CV: RRR MSK: Normal range of motion of extremities Psychiatric: Appropriate affect; alert and oriented x3  Results for orders placed or performed during the hospital encounter of 06/21/20 (from the past 48 hour(s))  Pregnancy, urine POC     Status: None   Collection Time: 06/21/20  5:38 AM  Result Value Ref Range   Preg Test, Ur NEGATIVE NEGATIVE    Comment:        THE SENSITIVITY OF THIS METHODOLOGY IS >24 mIU/mL     No results found.   A/P: Ms. Carrell is a very pleasant 12yoF with anal fissure - persists despite maximal medical management and topical CCBs  -The anatomy and physiology of the anal canal was discussed at length with the patient. The pathophysiology of anal fissures has been discussed at length with associated pictures and illustrations. -We have again reviewed perianal injection of Botox with anorectal exam under anesthesia -The planned procedure, material risks (including, but not limited to, pain, bleeding, infection, scarring, need for blood transfusion, damage to anal sphincter, incontinence of gas and/or stool, need for additional procedures, recurrence, pneumonia, heart attack, stroke, death) benefits and alternatives to surgery were discussed at length. I noted a good probability that the procedure would help improve her symptoms. The patient's questions were answered to her satisfaction, she voiced understanding and elected to proceed with surgery. Additionally, we discussed typical postoperative  expectations and the recovery process.  Nadeen Landau, MD [facility]

## 2020-06-21 NOTE — Op Note (Addendum)
06/21/2020  8:15 AM  PATIENT:  Melissa Nicholson  30 y.o. female  Patient Care Team: Via, Lennette Bihari, MD as PCP - General (Family Medicine)  PRE-OPERATIVE DIAGNOSIS:  Chronic anal fissure  POST-OPERATIVE DIAGNOSIS:  Chronic anal fissure x3  PROCEDURE:   1. Chemical spincterotomy (injection of perianal botox) 2. Anorectal exam under anesthesai  SURGEON: Nadeen Landau, MD FACS  ANESTHESIA:   local and MAC  SPECIMEN:  No Specimen  DISPOSITION OF SPECIMEN:  N/A  COUNTS:  Sponge, needle, and instrument counts were reported correct x2 at conclusion.  EBL: 1 mL  PLAN OF CARE: Discharge to home after PACU  PATIENT DISPOSITION:  PACU - hemodynamically stable.  OR FINDINGS: Left posterior external hemorrhoid; Anal fissure x3 all of which were shorter tract fissures which ended well before the dentate line - posterior midline, anterior midline and right lateral positions. Chemical sphincterotomy carried out with 100 U botox injected into the internal anal sphincter muscle circumferentially.  DESCRIPTION: The patient was identified in the preoperative holding area and taken to the OR where she was placed on the operating room table. SCDs were placed.  MAC anesthesia was induced without difficulty. The patient was then positioned in high lithotomy with Allen stirrups. Pressure points were then evaluated and padded.  She was then prepped and draped in usual sterile fashion.  A surgical timeout was performed indicating the correct patient, procedure, and positioning.  A perianal block was performed using a dilute mixture of 0.25% Marcaine with epinephrine and Exparel.   After ascertaining that an appropriate level of anesthesia had been achieved, a well lubricated digital rectal exam was performed. This demonstrated no palpable masses.  On the external exam she does have a small left posterior external hemorrhoid.  This is soft.  A Hill-Ferguson anoscope was into the anal canal and  circumferential inspection demonstrated 3 distinct somewhat chronic appearing anal fissures.  These all terminate well below/distal to the dentate line.  These are each approximately 1 to 1-1/2 cm in length.  They are located in the anterior midline, posterior midline, and right lateral position.  There are no ulcerations or any other concerning findings.  The external sphincter muscle is palpated and normal.  The internal sphincter muscle was palpated and normal.  100 units of Botox were diluted in 3 cc of injectable saline.  This was then carefully injected into the internal sphincter muscle circumferentially using a TB syringe.  The anal canal was irrigated.  Hemostasis was verified.  Sponge, needle, and instrument counts were reported correct x2.  Topical Dibucaine ointment was then applied.  4 x 4's/ABD/mesh underwear was placed.  She was then taken out of the lithotomy position, awakened from anesthesia, and transferred to stretcher for transport to PACU in satisfactory condition.  DISPOSITION: PACU in satisfactory condition.

## 2020-06-21 NOTE — Anesthesia Postprocedure Evaluation (Signed)
Anesthesia Post Note  Patient: Melissa Nicholson  Procedure(s) Performed: ANORECTAL EXAM UNDER ANESTHESIA. INJECTION OF ANAL BOTOX (N/A Rectum)     Patient location during evaluation: PACU Anesthesia Type: MAC Level of consciousness: awake and alert Pain management: pain level controlled Vital Signs Assessment: post-procedure vital signs reviewed and stable Respiratory status: spontaneous breathing, nonlabored ventilation and respiratory function stable Cardiovascular status: stable and blood pressure returned to baseline Postop Assessment: no apparent nausea or vomiting Anesthetic complications: no   No complications documented.  Last Vitals:  Vitals:   06/21/20 0845 06/21/20 0925  BP: 106/69 104/70  Pulse: 69 (!) 58  Resp: 14 14  Temp: 36.5 C 36.8 C  SpO2: 100% 100%    Last Pain:  Vitals:   06/21/20 0925  TempSrc:   PainSc: 0-No pain                 Vernice Mannina,W. EDMOND

## 2020-06-21 NOTE — Discharge Instructions (Addendum)
ANORECTAL SURGERY: POST OP INSTRUCTIONS  1. DIET: Follow a light bland diet the first 24 hours after arrival home, such as soup, liquids, crackers, etc.  Be sure to include lots of fluids daily.  Avoid fast food or heavy meals as your are more likely to get nauseated.  Eat a low fat diet the next few days after surgery.   2. Some bleeding with bowel movements is expected for the first couple of days but this should stop in between bowel movements  3. Take your usually prescribed home medications unless otherwise directed.  4. PAIN CONTROL: a. It is helpful to take an over-the-counter pain medication regularly for the first few days/weeks.  Choose from the following that works best for you: i. Ibuprofen (Advil, etc) Three 200mg  tabs every 6 hours as needed. ii. Acetaminophen (Tylenol, etc) 500-650mg  every 6 hours as needed iii. NOTE: You may take both of these medications together - most patients find it most helpful when alternating between the two (i.e. Ibuprofen at 6am, tylenol at 9am, ibuprofen at 12pm ...) b. A  prescription for pain medication may have been prescribed for you at discharge.  Take your pain medication as prescribed.  i. If you are having problems/concerns with the prescription medicine, please call us for further advice.  5. Avoid getting constipated.  Between the surgery and the pain medications, it is common to experience some constipation.  Increasing fluid intake (64oz of water per day) and taking a fiber supplement (such as Metamucil, Citrucel, FiberCon) 1-2 times a day regularly will usually help prevent this problem from occurring.  Take Miralax (over the counter) 1-2x/day while taking a narcotic pain medication. If no bowel movement after 48hours, you may additionally take a laxative like a bottle of Milk of Magnesia which can be purchased over the counter. Avoid enemas if possible as these are often painful.   6. Watch out for diarrhea.  If you have many loose bowel  movements, simplify your diet to bland foods.  Stop any stool softeners and decrease your fiber supplement. If this worsens or does not improve, please call us.  7. Wash / shower every day.  If you were discharged with a dressing, you may remove this the day after your surgery. You may shower normally, getting soap/water on your wound, particularly after bowel movements.  8. Soaking in a warm bath filled a couple inches ("Sitz bath") is a great way to clean the area after a bowel movement and many patients find it is a way to soothe the area.  9. ACTIVITIES as tolerated:   a. You may resume regular (light) daily activities beginning the next day--such as daily self-care, walking, climbing stairs--gradually increasing activities as tolerated.  If you can walk 30 minutes without difficulty, it is safe to try more intense activity such as jogging, treadmill, bicycling, low-impact aerobics, etc. b. Refrain from any heavy lifting or straining for the first 2 weeks after your procedure, particularly if your surgery was for hemorrhoids. c. Avoid activities that make your pain worse d. You may drive when you are no longer taking prescription pain medication, you can comfortably wear a seatbelt, and you can safely maneuver your car and apply brakes.  10. FOLLOW UP in our office a. Please call [facility] at [phone] to set up an appointment to see your surgeon in the office for a follow-up appointment approximately 2 weeks after your surgery. b. Make sure that you call for this appointment the day you arrive  home to insure a convenient appointment time.  9. If you have disability or family leave forms that need to be completed, you may have them completed by your primary care physician's office; for return to work instructions, please ask our office staff and they will be happy to assist you in obtaining this documentation   When to call us [phone]: 1. Poor pain control 2. Reactions / problems with  new medications (rash/itching, etc)  3. Fever over 101.5 F (38.5 C) 4. Inability to urinate 5. Nausea/vomiting 6. Worsening swelling or bruising 7. Continued bleeding from incision. 8. Increased pain, redness, or drainage from the incision  The clinic staff is available to answer your questions during regular business hours (8:30am-5pm).  Please dont hesitate to call and ask to speak to one of our nurses for clinical concerns.   A surgeon from [facility] is always on call at the hospitals   If you have a medical emergency, go to the nearest emergency room or call 911.   [facility], [place], [place], [place], [place]  [address] ? MAIN: [phone] FAX [phone] [email or web address]  No ibuprofen, Advil, Aleve, Motrin, or naproxen until after 2 pm today if needed. No Tylenol until after 12 pm (noon) today if needed.    Post Anesthesia Home Care Instructions  Activity: Get plenty of rest for the remainder of the day. A responsible individual must stay with you for 24 hours following the procedure.  For the next 24 hours, DO NOT: -Drive a car -Paediatric nurse -Drink alcoholic beverages -Take any medication unless instructed by your physician -Make any legal decisions or sign important papers.  Meals: Start with liquid foods such as gelatin or soup. Progress to regular foods as tolerated. Avoid greasy, spicy, heavy foods. If nausea and/or vomiting occur, drink only clear liquids until the nausea and/or vomiting subsides. Call your physician if vomiting continues.  Special Instructions/Symptoms: Your throat may feel dry or sore from the anesthesia or the breathing tube placed in your throat during surgery. If this causes discomfort, gargle with warm salt water. The discomfort should disappear within 24 hours.   Information for Discharge Teaching: EXPAREL (bupivacaine liposome injectable suspension)   Your surgeon or  anesthesiologist gave you EXPAREL(bupivacaine) to help control your pain after surgery.   EXPAREL is a local anesthetic that provides pain relief by numbing the tissue around the surgical site.  EXPAREL is designed to release pain medication over time and can control pain for up to 72 hours.  Depending on how you respond to EXPAREL, you may require less pain medication during your recovery.  Possible side effects:  Temporary loss of sensation or ability to move in the area where bupivacaine was injected.  Nausea, vomiting, constipation  Rarely, numbness and tingling in your mouth or lips, lightheadedness, or anxiety may occur.  Call your doctor right away if you think you may be experiencing any of these sensations, or if you have other questions regarding possible side effects.  Follow all other discharge instructions given to you by your surgeon or nurse. Eat a healthy diet and drink plenty of water or other fluids.  If you return to the hospital for any reason within 96 hours following the administration of EXPAREL, it is important for health care providers to know that you have received this anesthetic. A teal colored band has been placed on your arm with the date, time and amount of EXPAREL you have received  in order to alert and inform your health care providers. Please leave this armband in place for the full 96 hours following administration, and then you may remove the band. (Monday, January 24th)

## 2020-06-22 ENCOUNTER — Encounter (HOSPITAL_BASED_OUTPATIENT_CLINIC_OR_DEPARTMENT_OTHER): Payer: Self-pay | Admitting: Surgery

## 2020-08-13 ENCOUNTER — Encounter: Payer: Self-pay | Admitting: Nurse Practitioner

## 2020-08-22 DIAGNOSIS — F32A Depression, unspecified: Secondary | ICD-10-CM | POA: Insufficient documentation

## 2020-08-22 DIAGNOSIS — F329 Major depressive disorder, single episode, unspecified: Secondary | ICD-10-CM | POA: Insufficient documentation

## 2020-08-24 ENCOUNTER — Other Ambulatory Visit: Payer: Self-pay

## 2020-08-26 NOTE — Progress Notes (Addendum)
08/26/2020 Melissa Nicholson [id number] 08-Oct-1990   CHIEF COMPLAINT: Anal fissure, change in bowel pattern   HISTORY OF PRESENT ILLNESS: Melissa Nicholson is a 30 year old female with a past medical history of anxiety, Covid 19 infection and an fissures. She presents to our office today as referred by Dr. Nadeen Landau for further evaluation regarding multiple anal fissures and a variable bowel pattern. She was initially diagnosed with an anal fissure by her PCP one year ago which did not improve after using  topical Nifedipine gel.  She was evaluated by Dr. Dema Severin on 04/05/2020 and she was assessed to have an anal fissure.  She underwent a chemical sphincterotomy with injection of perianal Botox on 06/21/2020.  She stated her fissure discomfort initially improved but recurred 2 weeks post Botox injection.  She was seen in the office by Dr. Dema Severin 07/23/2020 for follow-up.  At that time, she reported having intermittent anal discomfort with bright red blood on the toilet tissue with intermittent loose stools.  On exam, she was found to have 3 shallow short anal fissures terminating well distal to the dentate line.  Due to having multiple anal fissures and a change in bowel pattern she was referred to our office for further evaluation.  Currently, she continues to have anorectal pain when passing a bowel movement with intermittent bright red blood on the toilet tissue and less frequently sees blood in the toilet water.  She typically has some anorectal pain after passing a bowel movement and feels a bit sore after defecation.  Her bowel pattern has varied over the past 3 to 4 months.  She passes nonbloody brown watery diarrhea bowel movement once weekly, loose mud-like stools 4 days weekly and a normal solid stool approximately 2 days weekly.  She sometimes feels as if stool is stuck which has occurred on 3 occasions over the past few months.  No associated abdominal pain.  She previously took  Benefiber without improvement and recently switched to Metamucil.  Infrequent NSAID use.  She last took ibuprofen 3 weeks ago for a headache. She typically takes Tylenol as needed.  No family history of IBD.  Paternal grandmother with history of colon cancer.   Past Medical History:  Diagnosis Date  . Anxiety   . Chronic anal fissure   . COVID 06/2019   sob, chest pain loss of taste and smell fever coughx 1 week all symptoms resolved  . Wears glasses    Past Surgical History:  Procedure Laterality Date  . lumps removed from both breasts Bilateral 2011   fibrous adenomas benign  . RECTAL EXAM UNDER ANESTHESIA N/A 06/21/2020   Procedure: ANORECTAL EXAM UNDER ANESTHESIA. INJECTION OF ANAL BOTOX;  Surgeon: Ileana Roup, MD;  Location: [facility];  Service: General;  Laterality: N/A;  . WISDOM TOOTH EXTRACTION     Social History: She is married.  She is Biochemist, clinical.  Non-smoker.  She drinks 1 alcoholic beverage daily or less.  No drug use.  Family History:  Mother with high cholesterol. Father with high cholesterol. Twin sister healthy. Maternal aunt with breast cancer.  Paternal grandmother was diagnosed with colon cancer in her 47s.  Allergies  Allergen Reactions  . Crab (Diagnostic)     Swelling of eyes     Outpatient Encounter Medications as of 08/27/2020  Medication Sig  . citalopram (CELEXA) 20 MG tablet Take 20 mg by mouth daily.  Marland Kitchen ELDERBERRY PO Take by mouth. daily  .  LO LOESTRIN FE 1 MG-10 MCG / 10 MCG tablet Take 1 tablet by mouth daily.  Cyndie Chime Estrad-Fe Biphas (LO LOESTRIN FE PO) Take by mouth at bedtime.  Marland Kitchen OVER THE COUNTER MEDICATION Zinc 1 daily  . OVER THE COUNTER MEDICATION Vitamin d 1 daily  . Wheat Dextrin (BENEFIBER) POWD Take by mouth. 2 teaspoons daily in am   No facility-administered encounter medications on file as of 08/27/2020.    REVIEW OF SYSTEMS:  Gen: Denies fever, sweats or chills. No weight loss.  CV:  Denies chest pain, palpitations or edema. Resp: Denies cough, shortness of breath of hemoptysis.  GI: See HPI.  GU : Denies urinary burning, blood in urine, increased urinary frequency or incontinence. MS: Denies joint pain, muscles aches or weakness. Derm: Denies rash, itchiness, skin lesions or unhealing ulcers. Psych: + Anxiety.  Heme: Denies bruising, bleeding. Neuro:  Denies headaches, dizziness or paresthesias. Endo:  Denies any problems with DM, thyroid or adrenal function.  PHYSICAL EXAM: BP 100/60   Pulse 69   Ht 5\' 4"  (1.626 m)   Wt 134 lb (60.8 kg)   BMI 23.00 kg/m   She is on oral BCPs. General: Well developed 30 year old female in no acute distress. Head: Normocephalic and atraumatic. Eyes:  Sclerae non-icteric, conjunctive pink. Ears: Normal auditory acuity. Mouth: Dentition intact. No ulcers or lesions. ADDENDUM: Patient has a thin metal appearing oral retainer which sits behind her front lower teeth.  Neck: Supple, no lymphadenopathy or thyromegaly.  Lungs: Clear bilaterally to auscultation without wheezes, crackles or rhonchi. Heart: Regular rate and rhythm. No murmur, rub or gallop appreciated.  Abdomen: Soft, nontender, non distended. No masses. No hepatosplenomegaly. Normoactive bowel sounds x 4 quadrants.  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 Limited rectal exam secondary to anorectal pain. Melissa CMA present during exam.  Musculoskeletal: Symmetrical with no gross deformities. Skin: Warm and dry. No rash or lesions on visible extremities. Extremities: No edema. Neurological: Alert oriented x 4, no focal deficits.  Psychological:  Alert and cooperative. Normal mood and affect.  ASSESSMENT AND PLAN:  18. 30 year old female with a history of rectal bleeding, anal fissure s/p chemical sphincterotomy with botox  06/2020. 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 -CBC, CMP, CRP, Sed rate, TTG, IGA -Pelvic MRI with and without contrast  -Diagnostic colonoscopy to rule out Crohn's disease/IBD. Colonoscopy benefits and risks discussed including risk with sedation, risk of bleeding, perforation and infection.  Colonoscopy to be scheduled in 2 to 3 weeks -Cipro 500mg  one po bid x 10 days. Metronidazole 500mg  one po bid x 10 days.  Patient aware no alcohol while she is on Metronidazole. -Florastor probiotic one po bid  -Patient to call our office if her symptoms worsen or if fever develop  2. Altered bowel pattern, diarrhea once weekly, loose mud like stools 4 days weekly and normal solid stools 2 days weekly. No abdominal pain.  -See plan in # 1  Further recommendations to be determined after the above evaluation completed   ADDENDUM: Patient has a thin metal appearing oral retainer which sits behind her front lower teeth.  I called the patient to further discuss her oral metal retainer.  I advised the patient to contact her  orthodontist and [facility] to verify if her retainer is compatible for MRI imaging or not.        CC:  ViaLennette Bihari, MD

## 2020-08-27 ENCOUNTER — Encounter: Payer: Self-pay | Admitting: Nurse Practitioner

## 2020-08-27 ENCOUNTER — Ambulatory Visit (INDEPENDENT_AMBULATORY_CARE_PROVIDER_SITE_OTHER): Payer: Managed Care, Other (non HMO) | Admitting: Nurse Practitioner

## 2020-08-27 ENCOUNTER — Other Ambulatory Visit (INDEPENDENT_AMBULATORY_CARE_PROVIDER_SITE_OTHER): Payer: Managed Care, Other (non HMO)

## 2020-08-27 ENCOUNTER — Telehealth: Payer: Self-pay

## 2020-08-27 VITALS — BP 100/60 | HR 69 | Ht 64.0 in | Wt 134.0 lb

## 2020-08-27 DIAGNOSIS — K602 Anal fissure, unspecified: Secondary | ICD-10-CM

## 2020-08-27 DIAGNOSIS — K625 Hemorrhage of anus and rectum: Secondary | ICD-10-CM

## 2020-08-27 DIAGNOSIS — K603 Anal fistula: Secondary | ICD-10-CM

## 2020-08-27 DIAGNOSIS — R194 Change in bowel habit: Secondary | ICD-10-CM | POA: Diagnosis not present

## 2020-08-27 LAB — CBC WITH DIFFERENTIAL/PLATELET
Basophils Absolute: 0 10*3/uL (ref 0.0–0.1)
Basophils Relative: 0.5 % (ref 0.0–3.0)
Eosinophils Absolute: 0.1 10*3/uL (ref 0.0–0.7)
Eosinophils Relative: 0.7 % (ref 0.0–5.0)
HCT: 39.3 % (ref 36.0–46.0)
Hemoglobin: 13 g/dL (ref 12.0–15.0)
Lymphocytes Relative: 20.3 % (ref 12.0–46.0)
Lymphs Abs: 1.6 10*3/uL (ref 0.7–4.0)
MCHC: 33.1 g/dL (ref 30.0–36.0)
MCV: 85.6 fl (ref 78.0–100.0)
Monocytes Absolute: 0.4 10*3/uL (ref 0.1–1.0)
Monocytes Relative: 4.9 % (ref 3.0–12.0)
Neutro Abs: 5.9 10*3/uL (ref 1.4–7.7)
Neutrophils Relative %: 73.6 % (ref 43.0–77.0)
Platelets: 294 10*3/uL (ref 150.0–400.0)
RBC: 4.6 Mil/uL (ref 3.87–5.11)
RDW: 12.8 % (ref 11.5–15.5)
WBC: 8 10*3/uL (ref 4.0–10.5)

## 2020-08-27 LAB — COMPREHENSIVE METABOLIC PANEL
ALT: 22 U/L (ref 0–35)
AST: 11 U/L (ref 0–37)
Albumin: 4.1 g/dL (ref 3.5–5.2)
Alkaline Phosphatase: 61 U/L (ref 39–117)
BUN: 9 mg/dL (ref 6–23)
CO2: 24 mEq/L (ref 19–32)
Calcium: 8.9 mg/dL (ref 8.4–10.5)
Chloride: 103 mEq/L (ref 96–112)
Creatinine, Ser: 0.85 mg/dL (ref 0.40–1.20)
GFR: 90.93 mL/min (ref >60.00)
Glucose, Bld: 104 mg/dL — ABNORMAL HIGH (ref 70–99)
Potassium: 4 mEq/L (ref 3.5–5.1)
Sodium: 137 mEq/L (ref 135–145)
Total Bilirubin: 0.3 mg/dL (ref 0.2–1.2)
Total Protein: 7.6 g/dL (ref 6.0–8.3)

## 2020-08-27 LAB — C-REACTIVE PROTEIN: CRP: 5.7 mg/dL (ref 0.5–20.0)

## 2020-08-27 LAB — SEDIMENTATION RATE: Sed Rate: 32 mm/hr — ABNORMAL HIGH (ref 0–20)

## 2020-08-27 MED ORDER — CIPROFLOXACIN HCL 500 MG PO TABS: 500.0000 mg | ORAL_TABLET | Freq: Two times a day (BID) | ORAL | 0 refills | Status: DC

## 2020-08-27 MED ORDER — METRONIDAZOLE 500 MG PO TABS: 500.0000 mg | ORAL_TABLET | Freq: Two times a day (BID) | ORAL | 0 refills | Status: DC

## 2020-08-27 MED ORDER — SACCHAROMYCES BOULARDII 250 MG PO CAPS: 250.0000 mg | ORAL_CAPSULE | Freq: Two times a day (BID) | ORAL | 1 refills | Status: DC

## 2020-08-27 NOTE — Progress Notes (Signed)
Reviewed.  Kelcie Currie L. Lamanda Rudder, MD, MPH  

## 2020-08-27 NOTE — Telephone Encounter (Signed)
Spoke with patient and let her know of the appointment. Patient is going to call [facility] imaging to make sure it is ok to proceed with a retainer she has in her mouth.

## 2020-08-27 NOTE — Telephone Encounter (Signed)
MRI Pelvis has been approved auth# [id number]  Appointment is scheduled at [employer] location on Sunday 09/02/20 arrival time 1:30 pm for a 2:00pm start time.  I have called patient and left a message for her to call me back so I can give her the details of the appointment.

## 2020-08-27 NOTE — Patient Instructions (Addendum)
If you are age 32 or younger, your body mass index should be between 19-25. Your Body mass index is 23 kg/m. If this is out of the aformentioned range listed, please consider follow up with your Primary Care Provider.   LABS:  Lab work has been ordered for you today. Our lab is located in the basement. Press "B" on the elevator. The lab is located at the first door on the left as you exit the elevator.  MEDICATION: We have sent the following medication to your pharmacy for you to pick up at your convenience:  Ciprofloxacin (CIPRO) 500 MG tablet Take 1 tablet twice a day for 10 days.     Florastor Probiotic take 1 twice a day.  metroNIDAZOLE (FLAGYL) 500 MG tablet Take 1 tablet twice a day for 10 days  We will call you in the next day or two about scheduling the MRI once we get it approved through your insurance.   Please call our office if your symptoms worsen or if you develop a fever. It was great seeing you today! Thank you for entrusting me with your care and choosing [facility].  Noralyn Pick, CRNP

## 2020-08-28 LAB — IGA: Immunoglobulin A: 258 mg/dL (ref 47–310)

## 2020-08-28 LAB — TISSUE TRANSGLUTAMINASE ABS,IGG,IGA
(tTG) Ab, IgA: <1 U/mL
(tTG) Ab, IgG: <1 U/mL

## 2020-08-30 ENCOUNTER — Telehealth: Payer: Self-pay | Admitting: Nurse Practitioner

## 2020-08-30 NOTE — Telephone Encounter (Signed)
The pt called to report she has diarrhea that started on Wed morning.  She was put on cipro and flagyl at her last office visit on 3/28  with [facility] for "rectal infection"  She is not sure if the diarrhea is due to the abx.  She would like to know what she should do.  She is set up for colon in May and MRI  in April.

## 2020-08-30 NOTE — Telephone Encounter (Signed)
The pt has been advised and will stop cipro and flagyl.  She is taking florastor twice daily for 2 weeks. She has confirmed MRI is ok per orthodontist and radiology.  She will call back if diarrhea worsens.

## 2020-08-30 NOTE — Telephone Encounter (Signed)
Patty, pls have patient stop the Cipro and Metronidazole. Pt to start Florastor probiotic one po bid x 2 weeks otc. Patient to proceed with her pelvic MRI to assess for anorectal fistula/abscess as scheduled. Please verify if the patient contacted her orthodontist and [place] [facility] to make sure her "permanent" lower dental retainer is compatible for MRI imaging. Pt to call office if diarrhea or anorectal sx worsen. Thx

## 2020-08-31 ENCOUNTER — Telehealth: Payer: Self-pay | Admitting: Gastroenterology

## 2020-08-31 NOTE — Telephone Encounter (Signed)
Pt states she was told yesterday to stop her antibiotics due to the diarrhea she was having. States her las dose was yesterday am and she is still having diarrhea today. Discussed with pt she may want to try some Imodium otc to help with the diarrhea. She also wanted to let us know she has switched back from metamucil to benefiber because the metamucil was making her tongue sore.

## 2020-09-02 ENCOUNTER — Other Ambulatory Visit: Payer: Self-pay

## 2020-09-02 ENCOUNTER — Ambulatory Visit
Admission: RE | Admit: 2020-09-02 | Discharge: 2020-09-02 | Disposition: A | Discharge status: Home / Self Care | Payer: Managed Care, Other (non HMO) | Source: Ambulatory Visit | Attending: Nurse Practitioner | Admitting: Nurse Practitioner

## 2020-09-02 DIAGNOSIS — R188 Other ascites: Secondary | ICD-10-CM | POA: Diagnosis not present

## 2020-09-02 DIAGNOSIS — K602 Anal fissure, unspecified: Secondary | ICD-10-CM

## 2020-09-02 DIAGNOSIS — K625 Hemorrhage of anus and rectum: Secondary | ICD-10-CM

## 2020-09-02 DIAGNOSIS — R194 Change in bowel habit: Secondary | ICD-10-CM

## 2020-09-02 DIAGNOSIS — K605 Anorectal fistula: Secondary | ICD-10-CM | POA: Diagnosis not present

## 2020-09-02 DIAGNOSIS — K603 Anal fistula: Secondary | ICD-10-CM

## 2020-09-02 MED ORDER — GADOBENATE DIMEGLUMINE 529 MG/ML IV SOLN
12.0000 mL | Freq: Once | INTRAVENOUS | Status: AC | PRN
  Administered 2020-09-02: 12 mL via INTRAVENOUS

## 2020-09-03 ENCOUNTER — Telehealth: Payer: Self-pay

## 2020-09-03 NOTE — Telephone Encounter (Signed)
Patient called in asking if she can receive a call back to go over her MRI results. Best number is [phone].

## 2020-09-03 NOTE — Telephone Encounter (Signed)
Pt calling for MRI results

## 2020-09-03 NOTE — Telephone Encounter (Signed)
Left message on machine to call back  

## 2020-09-03 NOTE — Telephone Encounter (Signed)
Pt called stating that she is still experiencing diarrhea. She would like some advise.

## 2020-09-03 NOTE — Telephone Encounter (Signed)
I called patient, see pelvic MRI notes, await further recommendations from Dr. Tarri Glenn.

## 2020-09-03 NOTE — Telephone Encounter (Signed)
I recommended CTE and colonoscopy. Will put her on a cancellation list in the event an appointment opens up earlier.  Thanks.  KLB

## 2020-09-03 NOTE — Telephone Encounter (Signed)
Received call report from [facility] regarding MRI:   IMPRESSION: 1. 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 No drainable fluid. 2. Signs of ileitis with ascites and interloop fluid of unclear etiology. Given constellation of findings would consider the possibility of inflammatory bowel disease. Would also suggest correlation with any ongoing clinical symptoms that would necessitate the need for further evaluation. Note that the appendix is not evaluated on the current study and findings are most pronounced in the RIGHT lower quadrant. 3. Ascites in the pelvis. 4. Limited assessment of vascular structures due to limited coverage of the current study.

## 2020-09-03 NOTE — Telephone Encounter (Signed)
Melissa Nicholson, I called patient earlier today and discussed her pelvic MRI results. She new I was waiting on Dr. Tarri Glenn' input. Pls contact the patient and let her know Dr. Tarri Glenn reviewed her pelvic MRI result and she recommended for her to undergo a CT enterography to further evaluate her small bowel. Pls schedule her for a CT enterography which should be done before proceeding with a colonoscopy. Pls make sure patient has not skipped any of her birth control pills,  If she has a late menstrual cycle then send her to the lab for beta HCG level prior to proceeding with CT enterography. Thank you.

## 2020-09-04 ENCOUNTER — Other Ambulatory Visit: Payer: Self-pay

## 2020-09-04 DIAGNOSIS — K602 Anal fissure, unspecified: Secondary | ICD-10-CM

## 2020-09-04 DIAGNOSIS — R188 Other ascites: Secondary | ICD-10-CM

## 2020-09-04 DIAGNOSIS — K529 Noninfective gastroenteritis and colitis, unspecified: Secondary | ICD-10-CM

## 2020-09-04 NOTE — Telephone Encounter (Signed)
Pt scheduled for CT entero of A/P at [facility] 09/11/20 @11 :30am, pt to arrive there at 10am to drink contrast. Pt to be NPO after 7:30am. Pt aware of appt.

## 2020-09-04 NOTE — Telephone Encounter (Signed)
Several messages left for the pt to return call if she is still experiencing symptoms.

## 2020-09-11 ENCOUNTER — Ambulatory Visit (HOSPITAL_COMMUNITY)
Admission: RE | Admit: 2020-09-11 | Discharge: 2020-09-11 | Disposition: A | Discharge status: Home / Self Care | Payer: BC Managed Care – PPO | Source: Ambulatory Visit | Attending: Gastroenterology | Admitting: Gastroenterology

## 2020-09-11 ENCOUNTER — Encounter (HOSPITAL_COMMUNITY): Payer: Self-pay

## 2020-09-11 ENCOUNTER — Other Ambulatory Visit: Payer: Self-pay

## 2020-09-11 DIAGNOSIS — R197 Diarrhea, unspecified: Secondary | ICD-10-CM | POA: Diagnosis not present

## 2020-09-11 DIAGNOSIS — K6389 Other specified diseases of intestine: Secondary | ICD-10-CM | POA: Diagnosis not present

## 2020-09-11 DIAGNOSIS — K602 Anal fissure, unspecified: Secondary | ICD-10-CM | POA: Diagnosis not present

## 2020-09-11 DIAGNOSIS — R188 Other ascites: Secondary | ICD-10-CM | POA: Insufficient documentation

## 2020-09-11 DIAGNOSIS — K529 Noninfective gastroenteritis and colitis, unspecified: Secondary | ICD-10-CM | POA: Insufficient documentation

## 2020-09-11 DIAGNOSIS — K3189 Other diseases of stomach and duodenum: Secondary | ICD-10-CM | POA: Diagnosis not present

## 2020-09-11 DIAGNOSIS — K603 Anal fistula: Secondary | ICD-10-CM | POA: Diagnosis not present

## 2020-09-11 MED ORDER — IOHEXOL 300 MG/ML  SOLN
100.0000 mL | Freq: Once | INTRAMUSCULAR | Status: AC | PRN
  Administered 2020-09-11: 100 mL via INTRAVENOUS

## 2020-09-11 MED ORDER — BARIUM SULFATE 0.1 % PO SUSP
ORAL | Status: AC
  Filled 2020-09-11: qty 3

## 2020-09-13 ENCOUNTER — Telehealth: Payer: Self-pay | Admitting: Nurse Practitioner

## 2020-09-13 ENCOUNTER — Other Ambulatory Visit: Payer: Self-pay

## 2020-09-13 DIAGNOSIS — R1084 Generalized abdominal pain: Secondary | ICD-10-CM

## 2020-09-13 DIAGNOSIS — K50919 Crohn's disease, unspecified, with unspecified complications: Secondary | ICD-10-CM

## 2020-09-13 DIAGNOSIS — K602 Anal fissure, unspecified: Secondary | ICD-10-CM

## 2020-09-13 DIAGNOSIS — R188 Other ascites: Secondary | ICD-10-CM

## 2020-09-13 NOTE — Progress Notes (Signed)
Per Jaclyn Shaggy add Hep C antibody order.

## 2020-09-13 NOTE — Telephone Encounter (Signed)
Inbound call from patient requesting a call back with her CT scan results please.

## 2020-09-13 NOTE — Telephone Encounter (Signed)
Spoke with [employer]. She will call the patient to discuss the results. Labs ordered and entered into EMR.

## 2020-09-17 ENCOUNTER — Encounter: Payer: Self-pay | Admitting: Gastroenterology

## 2020-09-19 ENCOUNTER — Ambulatory Visit (AMBULATORY_SURGERY_CENTER): Payer: BC Managed Care – PPO | Admitting: Gastroenterology

## 2020-09-19 ENCOUNTER — Other Ambulatory Visit: Payer: Self-pay | Admitting: Gastroenterology

## 2020-09-19 ENCOUNTER — Other Ambulatory Visit: Payer: Self-pay

## 2020-09-19 ENCOUNTER — Encounter: Payer: Self-pay | Admitting: Gastroenterology

## 2020-09-19 VITALS — BP 118/67 | HR 63 | Temp 98.6°F | Resp 21 | Ht 64.0 in | Wt 134.0 lb

## 2020-09-19 DIAGNOSIS — Z1211 Encounter for screening for malignant neoplasm of colon: Secondary | ICD-10-CM | POA: Diagnosis not present

## 2020-09-19 DIAGNOSIS — K602 Anal fissure, unspecified: Secondary | ICD-10-CM | POA: Diagnosis not present

## 2020-09-19 DIAGNOSIS — R935 Abnormal findings on diagnostic imaging of other abdominal regions, including retroperitoneum: Secondary | ICD-10-CM

## 2020-09-19 DIAGNOSIS — K50118 Crohn's disease of large intestine with other complication: Secondary | ICD-10-CM

## 2020-09-19 DIAGNOSIS — K5190 Ulcerative colitis, unspecified, without complications: Secondary | ICD-10-CM | POA: Diagnosis not present

## 2020-09-19 DIAGNOSIS — K529 Noninfective gastroenteritis and colitis, unspecified: Secondary | ICD-10-CM | POA: Diagnosis not present

## 2020-09-19 MED ORDER — SODIUM CHLORIDE 0.9 % IV SOLN: 500.0000 mL | Freq: Once | INTRAVENOUS | Status: DC

## 2020-09-19 NOTE — Progress Notes (Signed)
Called to room to assist during endoscopic procedure.  Patient ID and intended procedure confirmed with present staff. Received instructions for my participation in the procedure from the performing physician.  

## 2020-09-19 NOTE — Op Note (Signed)
[facility] Patient Name: Melissa Nicholson Procedure Date: 09/19/2020 3:11 PM MRN: [id number] Endoscopist: Thornton Park MD, MD Age: 30 Referring MD:  Date of Birth: 12-15-1990 Gender: Female Account #: [id number] Procedure:                Colonoscopy Indications:              Abnormal CT of the GI tract - suspected Crohn's                            disease Medicines:                Monitored Anesthesia Care Procedure:                Pre-Anesthesia Assessment:                           - Prior to the procedure, a History and Physical                            was performed, and patient medications and                            allergies were reviewed. The patient's tolerance of                            previous anesthesia was also reviewed. The risks                            and benefits of the procedure and the sedation                            options and risks were discussed with the patient.                            All questions were answered, and informed consent                            was obtained. Prior Anticoagulants: The patient has                            taken no previous anticoagulant or antiplatelet                            agents. ASA Grade Assessment: II - A patient with                            mild systemic disease. After reviewing the risks                            and benefits, the patient was deemed in                            satisfactory condition to undergo the procedure.  After obtaining informed consent, the colonoscope                            was passed under direct vision. Throughout the                            procedure, the patient's blood pressure, pulse, and                            oxygen saturations were monitored continuously. The                            Olympus PCF-H190DL (#[id number]) Colonoscope was                            introduced through the anus and advanced to the 3-4                             cm into the ileum. The colonoscopy was performed                            without difficulty. The patient tolerated the                            procedure well. The quality of the bowel                            preparation was good. The terminal ileum, ileocecal                            valve, appendiceal orifice, and rectum were                            photographed. Scope In: 3:18:31 PM Scope Out: 3:39:30 PM Scope Withdrawal Time: 0 hours 17 minutes 43 seconds  Total Procedure Duration: 0 hours 20 minutes 59 seconds  Findings:                 The perianal and digital rectal examinations                            revealed an anterior fissure with a sentinel tag.                           Patchy mild inflammation characterized by altered                            vascularity, erythema and granularity was found in                            the entire colon except in the distal sigmoid colon                            and rectum. Biopsies were taken throughout the  colon with a cold forceps for histology. Estimated                            blood loss was minimal.                           The terminal ileum appeared normal. Approximately                            3-4 mm were examined. I was unable to advance the                            colonoscope further due to looping. Biopsies were                            taken with a cold forceps for histology. Estimated                            blood loss was minimal.                           The exam was otherwise without abnormality on                            direct and retroflexion views. Complications:            No immediate complications. Estimated blood loss:                            Minimal. Estimated Blood Loss:     Estimated blood loss was minimal. Impression:               - Patchy mild inflammation was found in almost the                            entire  examined colon, except for the distal                            sigmoid and rectum, secondary to colitis. Biopsied.                           - The examined portion of the ileum was normal.                            Biopsied.                           - The examination was otherwise normal on direct                            and retroflexion views. Recommendation:           - Patient has a contact number available for                            emergencies. The signs and  symptoms of potential                            delayed complications were discussed with the                            patient. Return to normal activities tomorrow.                            Written discharge instructions were provided to the                            patient.                           - Resume previous diet.                           - Continue present medications.                           - Await pathology results.                           - Additional recommendations will be made after the                            pathology results are available. Thornton Park MD, MD 09/19/2020 3:49:50 PM This report has been signed electronically.

## 2020-09-19 NOTE — Progress Notes (Signed)
VS-CW 

## 2020-09-19 NOTE — Progress Notes (Signed)
A and O x3. Report to RN. Tolerated MAC anesthesia well.

## 2020-09-19 NOTE — Patient Instructions (Signed)
The biopsies taken today have been sent for pathology.  The results can take 1-3 weeks to receive.  When your next colonoscopy should occur will be based on the pathology results.    You may resume your previous diet and medication schedule.  Thank you for allowing [place] to care for you today!!!   YOU HAD AN ENDOSCOPIC PROCEDURE TODAY AT [facility]:   Refer to the procedure report that was given to you for any specific questions about what was found during the examination.  If the procedure report does not answer your questions, please call your gastroenterologist to clarify.  If you requested that your care partner not be given the details of your procedure findings, then the procedure report has been included in a sealed envelope for you to review at your convenience later.  YOU SHOULD EXPECT: Some feelings of bloating in the abdomen. Passage of more gas than usual.  Walking can help get rid of the air that was put into your GI tract during the procedure and reduce the bloating. If you had a lower endoscopy (such as a colonoscopy or flexible sigmoidoscopy) you may notice spotting of blood in your stool or on the toilet paper. If you underwent a bowel prep for your procedure, you may not have a normal bowel movement for a few days.  Please Note:  You might notice some irritation and congestion in your nose or some drainage.  This is from the oxygen used during your procedure.  There is no need for concern and it should clear up in a day or so.  SYMPTOMS TO REPORT IMMEDIATELY:   Following lower endoscopy (colonoscopy or flexible sigmoidoscopy):  Excessive amounts of blood in the stool  Significant tenderness or worsening of abdominal pains  Swelling of the abdomen that is new, acute  Fever of 100F or higher  For urgent or emergent issues, a gastroenterologist can be reached at any hour by calling [phone]. Do not use MyChart messaging for urgent concerns.    DIET:  We  do recommend a small meal at first, but then you may proceed to your regular diet.  Drink plenty of fluids but you should avoid alcoholic beverages for 24 hours.  ACTIVITY:  You should plan to take it easy for the rest of today and you should NOT DRIVE or use heavy machinery until tomorrow (because of the sedation medicines used during the test).    FOLLOW UP: Our staff will call the number listed on your records Friday morning between 7:15 am and 8:15 am following your procedure to check on you and address any questions or concerns that you may have regarding the information given to you following your procedure. If we do not reach you, we will leave a message.  We will attempt to reach you two times.  During this call, we will ask if you have developed any symptoms of COVID 19. If you develop any symptoms (ie: fever, flu-like symptoms, shortness of breath, cough etc.) before then, please call [phone].  If you test positive for Covid 19 in the 2 weeks post procedure, please call and report this information to [place].    If any biopsies were taken you will be contacted by phone or by letter within the next 1-3 weeks.  Please call us at [phone] if you have not heard about the biopsies in 3 weeks.    SIGNATURES/CONFIDENTIALITY: You and/or your care partner have signed paperwork which will be entered  into your electronic medical record.  These signatures attest to the fact that that the information above on your After Visit Summary has been reviewed and is understood.  Full responsibility of the confidentiality of this discharge information lies with you and/or your care-partner.

## 2020-09-21 ENCOUNTER — Telehealth: Payer: Self-pay | Admitting: *Deleted

## 2020-09-21 NOTE — Telephone Encounter (Signed)
  Follow up Call-  Call back number 09/19/2020  Post procedure Call Back phone  # [phone]  Permission to leave phone message Yes  Some recent data might be hidden     Patient questions:  Do you have a fever, pain , or abdominal swelling? No. Pain Score  0 *  Have you tolerated food without any problems? Yes.    Have you been able to return to your normal activities? Yes.    Do you have any questions about your discharge instructions: Diet   No. Medications  No. Follow up visit  No.  Do you have questions or concerns about your Care? No.  Actions: * If pain score is 4 or above: No action needed, pain <4.  1. Have you developed a fever since your procedure? no  2.   Have you had an respiratory symptoms (SOB or cough) since your procedure? no  3.   Have you tested positive for COVID 19 since your procedure no  4.   Have you had any family members/close contacts diagnosed with the COVID 19 since your procedure?  no   If yes to any of these questions please route to Joylene John, RN and Joella Prince, RN

## 2020-09-28 ENCOUNTER — Telehealth: Payer: Self-pay | Admitting: Gastroenterology

## 2020-09-28 ENCOUNTER — Telehealth: Payer: Self-pay

## 2020-09-28 NOTE — Telephone Encounter (Signed)
Result not back yet, she will be notified when path back.

## 2020-09-28 NOTE — Telephone Encounter (Deleted)
Pt called inquiring about path results. Pls call her when they are ready.  

## 2020-09-28 NOTE — Telephone Encounter (Signed)
Pt called inquiring about path results. pls call her when they are ready.

## 2020-10-01 ENCOUNTER — Other Ambulatory Visit: Payer: Self-pay

## 2020-10-01 ENCOUNTER — Telehealth: Payer: Self-pay | Admitting: Gastroenterology

## 2020-10-01 ENCOUNTER — Other Ambulatory Visit: Payer: BC Managed Care – PPO

## 2020-10-01 DIAGNOSIS — K50919 Crohn's disease, unspecified, with unspecified complications: Secondary | ICD-10-CM

## 2020-10-01 DIAGNOSIS — K529 Noninfective gastroenteritis and colitis, unspecified: Secondary | ICD-10-CM

## 2020-10-01 MED ORDER — MESALAMINE 1.2 G PO TBEC: 2.4000 g | DELAYED_RELEASE_TABLET | Freq: Every day | ORAL | 3 refills | Status: DC

## 2020-10-01 MED ORDER — BUDESONIDE ER 9 MG PO TB24: 9.0000 mg | ORAL_TABLET | Freq: Every morning | ORAL | 3 refills | Status: DC

## 2020-10-01 NOTE — Telephone Encounter (Signed)
See result note.  

## 2020-10-01 NOTE — Addendum Note (Signed)
Addended by: Octavio Manns E on: 10/01/2020 04:47 PM   Modules accepted: Orders

## 2020-10-02 ENCOUNTER — Other Ambulatory Visit: Payer: BC Managed Care – PPO

## 2020-10-02 ENCOUNTER — Telehealth: Payer: Self-pay

## 2020-10-02 ENCOUNTER — Other Ambulatory Visit: Payer: Self-pay

## 2020-10-02 DIAGNOSIS — K50919 Crohn's disease, unspecified, with unspecified complications: Secondary | ICD-10-CM | POA: Diagnosis not present

## 2020-10-02 DIAGNOSIS — K529 Noninfective gastroenteritis and colitis, unspecified: Secondary | ICD-10-CM

## 2020-10-02 DIAGNOSIS — R188 Other ascites: Secondary | ICD-10-CM | POA: Diagnosis not present

## 2020-10-02 DIAGNOSIS — R1084 Generalized abdominal pain: Secondary | ICD-10-CM

## 2020-10-02 DIAGNOSIS — K602 Anal fissure, unspecified: Secondary | ICD-10-CM | POA: Diagnosis not present

## 2020-10-02 MED ORDER — BUDESONIDE 3 MG PO CPEP: 9.0000 mg | ORAL_CAPSULE | Freq: Every morning | ORAL | 2 refills | Status: DC

## 2020-10-02 NOTE — Telephone Encounter (Signed)
Following My Chart message has been sent to pt:  Hi Melissa Nicholson,   The PA for the Uceris was completed this morning and we received a response from your insurance company. Unfortunately, they have denied this medication. We have provided Dr. Tarri Glenn with their denial letter for her to review and to advise regarding how she would like to proceed. Per Dr. Tarri Glenn orders and according to your insurance plan, an order for Budesonide 3mg  has been sent to your pharmacy. She would like you to take 9mg  (a total of 3 tablets) every morning in addition to Lialda. If you have any questions or concerns, please do not hesitate to contact us.   Thank you, [facility]

## 2020-10-02 NOTE — Telephone Encounter (Signed)
PRIOR AUTHORIZATION  PA initiation date: 10/02/20  Medication: Budesonide Insurance Company: [employer] completed electronically through [employer] My Meds: Yes  Will await insurance response re: approval/denial.  Arther Dames (Key: [id number])  Your information has been submitted to [place]. [employer] [place] will review the request and notify you of the determination decision directly, typically within 72 hours of receiving all information.  You will also receive your request decision electronically. To check for an update later, open this request again from your dashboard.  If [employer] has not responded within the specified timeframe or if you have any questions about your PA submission, contact [place] [facility] directly at [phone].  Arali Cariker Key: [id number] help? Call us at [phone] Status Sent to [facility] Budesonide ER 9MG  er tablets Form [employer] control surveyor Form (CB)

## 2020-10-02 NOTE — Telephone Encounter (Signed)
DENIAL  Medication: Budesonide Insurance Company: [employer] PA response: DENIED Rationale: Awaiting letter Misc. Notes: Arther Dames (Key: [id number])  This request has received a Unfavorable outcome.  Please see letter faxed to your office for details on this adverse benefit determination.  Please note any additional information provided by Sherre Poot Parmer at the bottom of this request.  Arther Dames Key: [id number] help? Call us at [phone] Outcome Deniedtoday Drug Budesonide ER 9MG  er tablets Form [employer] control surveyor Form (CB)

## 2020-10-02 NOTE — Telephone Encounter (Signed)
Outpatient Medication Detail   Disp Refills Start End   budesonide (ENTOCORT EC) 3 MG 24 hr capsule 90 capsule 2 10/02/2020    Sig - Route: Take 3 capsules (9 mg total) by mouth every morning. - Oral   Sent to pharmacy as: budesonide (ENTOCORT EC) 3 MG 24 hr capsule   E-Prescribing Status: Receipt confirmed by pharmacy (10/02/2020  1:21 PM EDT)

## 2020-10-02 NOTE — Addendum Note (Signed)
Addended by: Octavio Manns E on: 10/02/2020 08:04 AM   Modules accepted: Orders

## 2020-10-04 LAB — QUANTIFERON-TB GOLD PLUS
Mitogen-NIL: >10 IU/mL
NIL: 0.06 IU/mL
QuantiFERON-TB Gold Plus: NEGATIVE
TB1-NIL: 0.03 IU/mL
TB2-NIL: 0.01 IU/mL

## 2020-10-04 LAB — CALPROTECTIN, FECAL: Calprotectin, Fecal: 673 ug/g — ABNORMAL HIGH (ref 0–120)

## 2020-10-05 ENCOUNTER — Encounter: Payer: Managed Care, Other (non HMO) | Admitting: Gastroenterology

## 2020-10-07 NOTE — Telephone Encounter (Signed)
Patient completed the prior requested lab work: quantiferon gold, Hep B serologies and TPMT.

## 2020-10-09 LAB — THIOPURINE METHYLTRANSFERASE (TPMT), RBC: TPMT Activity:: 19.1 Units/mL RBC

## 2020-10-09 LAB — HEPATITIS C ANTIBODY: Hep C Virus Ab: 0.1 s/co ratio (ref 0.0–0.9)

## 2020-10-09 LAB — HEPATITIS B CORE ANTIBODY, TOTAL: Hep B Core Total Ab: NEGATIVE

## 2020-10-09 LAB — HEPATITIS B SURFACE ANTIGEN: Hepatitis B Surface Ag: NEGATIVE

## 2020-10-09 LAB — HEPATITIS B SURFACE ANTIBODY,QUALITATIVE: Hep B Surface Ab, Qual: REACTIVE

## 2020-10-24 NOTE — Progress Notes (Signed)
10/24/2020 Melissa Nicholson [id number] December 08, 1990   Chief Complaint:  Follow up after colonoscopy, anal fissures and abdominal pain   History of Present Illness: Melissa Nicholson is a 30 year old female with a past medical history of anxiety, Covid 19 infection and an fissures s/p chemical sphincterotomy with injection of perianal Botox on 06/21/2020. I initially saw Melissa in office on 08/27/2020 due to having loose stools 4 days weekly and persistent anal fissure pain and bleeding.  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 Cipro 500 mg p.o. twice daily and Metronidazole 500 mg p.o. twice daily ID was ordered but was discontinued after few days says she developed diarrhea and worse anal rectal discomfort after taking the antibiotics. A pelvic MRI was done 09/03/2020 which identified a small perianal fistula.  A small bowel CT enterography 09/12/2020 showed fairly marked wall thickening to the distal and terminal ileum suggestive of active Crohn's disease. She underwent a colonoscopy by Dr. Tarri Glenn on 09/19/2020 which identified inflammation throughout the entire colon except for the distal sigmoid and rectum.  Biopsies of the right colon showed ulcer with moderate active colitis, transverse colon with focal active colitis, left colon and rectum without significant pathologic findings.  The TI was normal.  The pathology report was consistent with IBD.  Fecal calprotectin level was elevated at 673 on 10/02/2020.  She was prescribed Entocort 9 mg daily and Lialda 2.4 g daily which she started taking on 10/03/2020.  She was advised to avoid all NSAIDs.  She presents to our office today for further GI follow-up.  She reports having less abdominal pain and her bowel movements are firmer.  She is passing 2 softly formed bowel movements daily.  She is seeing a small amount of bright red blood on  the toilet tissue 2-3 times over the past 4 weeks.  She continues to have anal rectal soreness, no severe anorectal pain.  No obvious purulent discharge from the anal rectal area.  No fever, sweats or chills.  Appetite has improved. She reported losing at least 20lbs over the past year. Weight was 134 on 09/19/2020. Today's weight 128lbs.  She requests a prescription for Magic mouthwash for intermittent oral sores which come and go.  She denies having any oral ulcerations at this time.  CBC Latest Ref Rng & Units 08/27/2020  WBC 4.0 - 10.5 K/uL 8.0  Hemoglobin 12.0 - 15.0 g/dL 13.0  Hematocrit 36.0 - 46.0 % 39.3  Platelets 150.0 - 400.0 K/uL 294.0   CMP Latest Ref Rng & Units 08/27/2020  Glucose 70 - 99 mg/dL 104(H)  BUN 6 - 23 mg/dL 9  Creatinine 0.40 - 1.20 mg/dL 0.85  Sodium 135 - 145 mEq/L 137  Potassium 3.5 - 5.1 mEq/L 4.0  Chloride 96 - 112 mEq/L 103  CO2 19 - 32 mEq/L 24  Calcium 8.4 - 10.5 mg/dL 8.9  Total Protein 6.0 - 8.3 g/dL 7.6  Total Bilirubin 0.2 - 1.2 mg/dL 0.3  Alkaline Phos 39 - 117 U/L 61  AST 0 - 37 U/L 11  ALT 0 - 35 U/L 22  CRP 5.7.  Laboratory studies 10/02/2020: Hepatitis B surface antigen negative.  Hepatitis B surface antibody reactive.  Hepatitis B core total antibody negative.  Hep C antibody negative.  Quantiferon gold negative.  TPMT activity within normal range with a level of 19.1. Fecal  calprotectin level 673.     Pelvic MRI w/wo contrast 09/03/2020: 1. 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 No drainable fluid. 2. Signs of ileitis with ascites and interloop fluid of unclear etiology. Given constellation of findings would consider the possibility of inflammatory bowel disease. Would also suggest correlation with any ongoing clinical symptoms that  would necessitate the need for further evaluation. Note that the appendix is not evaluated on the current study and findings are most pronounced in the RIGHT lower quadrant. 3. Ascites in the pelvis. 4. Limited assessment of vascular structures due to limited coverage of the current study  Small bowel CT enterography 09/12/2020 1. Fairly marked wall thickening and enhancement involving the distal and terminal ileum most suggestive of active Crohn's disease. 2. Moderate fluid throughout the colon typically seen with diarrhea. 3. No other significant abdominal/pelvic findings, mass lesions or adenopathy.  Colonoscopy 09/19/2020: - Patchy mild inflammation was found in almost the entire examined colon, except for the distal sigmoid and rectum, secondary to colitis. Biopsied. - The examined portion of the ileum was normal. Biopsied. - The examination was otherwise normal on direct and retroflexion views. Biopsy report: 1. Surgical [P], small bowel, terminal ileum - ILEAL MUCOSA WITH NO SIGNIFICANT PATHOLOGIC FINDINGS. - NEGATIVE FOR ACTIVE INFLAMMATION, GRANULOMATA AND DYSPLASIA. 2. Surgical [P], right colon - ULCER WITH MODERATE ACTIVE COLITIS. - NEGATIVE FOR GRANULOMATA AND DYSPLASIA. 3. Surgical [P], colon, transverse - FOCAL ACTIVE COLITIS. - NEGATIVE FOR GRANULOMATA AND DYSPLASIA. 4. Surgical [P], left colon - COLONIC MUCOSA WITH NO SIGNIFICANT PATHOLOGIC FINDINGS. - NEGATIVE FOR ACTIVE INFLAMMATION, GRANULOMATA AND DYSPLASIA. 5. Surgical [P], colon, rectum - COLONIC MUCOSA WITH NO SIGNIFICANT PATHOLOGIC FINDINGS. - NEGATIVE FOR ACTIVE INFLAMMATION, GRANULOMATA AND DYSPLASIA  Current Outpatient Medications on File Prior to Visit  Medication Sig Dispense Refill  . budesonide (ENTOCORT EC) 3 MG 24 hr capsule Take 3 capsules (9 mg total) by mouth every morning. 90 capsule 2  . citalopram (CELEXA) 20 MG tablet Take 20 mg by mouth daily.    Marland Kitchen ELDERBERRY PO Take by mouth. daily     . LO LOESTRIN FE 1 MG-10 MCG / 10 MCG tablet Take 1 tablet by mouth daily.     No current facility-administered medications on file prior to visit.    Current Medications, Allergies, Past Medical History, Past Surgical History, Family History and Social History were reviewed in [employer] record.   Review of Systems:   Constitutional: No further weight loss.  No fever, sweats or chills. Respiratory: Negative for shortness of breath.   Cardiovascular: Negative for chest pain, palpitations and leg swelling.  Gastrointestinal: See HPI.  Musculoskeletal: Negative for back pain or muscle aches.  Neurological: Negative for dizziness, headaches or paresthesias.    Physical Exam: BP 110/60 (BP Location: Left Arm, Patient Position: Sitting, Cuff Size: Normal)   Pulse 76   Ht 5' 4.5" (1.638 m) Comment: height measured without shoes  Wt 128 lb 8 oz (58.3 kg)   BMI 21.72 kg/m   Wt Readings from Last 3 Encounters:  10/26/20 128 lb 8 oz (58.3 kg)  09/19/20 134 lb (60.8 kg)  08/27/20 134 lb (60.8 kg)   General: Well developed 30 year old female in no acute distress. Head: Normocephalic and atraumatic. Eyes: No scleral icterus. Conjunctiva pink . Ears: Normal auditory  acuity. Mouth: Dentition intact. No ulcers or lesions.  Lungs: Clear throughout to auscultation. Heart: Regular rate and rhythm, no murmur. Abdomen: Soft, nontender and nondistended. No masses or hepatomegaly. Normal bowel sounds x 4 quadrants.  Rectal: Gaping open cratered anterior fissure and a similar posterior fissures with yellow exudate and thick rubbery sentinel tags, small fistula orifice x 2  to the right posterior anal area without exudate.  Musculoskeletal: Symmetrical with no gross deformities. Extremities: No edema. Neurological: Alert oriented x 4. No focal deficits.  Psychological: Alert and cooperative. Normal mood and affect  Assessment and Recommendations:  25. 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  A pelvic MRI was done 09/03/2020 which identified a small perianal fistula.  A small bowel CT enterography 09/12/2020 showed fairly marked wall thickening to the distal and terminal ileum suggestive of active Crohn's disease. Colonoscopy 09/19/2020  identified inflammation throughout the entire colon except for the distal sigmoid and rectum.  Biopsies of the right colon showed ulcer with moderate active colitis, transverse colon with focal active colitis, left colon and rectum without significant pathologic findings.  The TI was normal.  The pathology report was consistent with IBD.  Pre biologic screening Hepatitis B serologies, Quantiferon gold and TPMT results were normal.  Abdominal pain and loose stools have significantly improved on Mesalamine 2.4 g daily and Budesonide 9 mg p.o. daily. Anal exam today without improvement, persistent open anterior and posterior fissures. See media for photos of anal exam. (Patient provided consent for photos). -She will likely require Humira or Remicade for Crohn's anorectal disease, await further clarification regarding her diagnosis and treatment plan from Dr. Tarri Glenn -Increase Lialda 1.2 g 4 tabs p.o. daily -Continue Entocort 9 mg p.o. daily for now, will eventually taper off -Diet as tolerated -No NSAID -Follow-up in the office with Dr. Tarri Glenn in 2 months and as needed -Prescription for Magic mouthwash 5 ml swish and spit 4 times daily x7 days as needed for oral ulcer discomfort   Today's encounter was 25 minutes which includes precharting, chart review, review of results, physical exam,  face-to-face time used for counseling as well as treatment plan and follow-up.

## 2020-10-26 ENCOUNTER — Ambulatory Visit: Payer: BC Managed Care – PPO | Admitting: Nurse Practitioner

## 2020-10-26 ENCOUNTER — Telehealth: Payer: Self-pay

## 2020-10-26 ENCOUNTER — Encounter: Payer: Self-pay | Admitting: Nurse Practitioner

## 2020-10-26 ENCOUNTER — Other Ambulatory Visit: Payer: Self-pay

## 2020-10-26 VITALS — BP 110/60 | HR 76 | Ht 64.5 in | Wt 128.5 lb

## 2020-10-26 DIAGNOSIS — K529 Noninfective gastroenteritis and colitis, unspecified: Secondary | ICD-10-CM

## 2020-10-26 DIAGNOSIS — K602 Anal fissure, unspecified: Secondary | ICD-10-CM | POA: Diagnosis not present

## 2020-10-26 MED ORDER — MESALAMINE 1.2 G PO TBEC: DELAYED_RELEASE_TABLET | ORAL | 1 refills | Status: DC

## 2020-10-26 MED ORDER — MAGIC MOUTHWASH: 5.0000 mL | Freq: Four times a day (QID) | ORAL | 0 refills | Status: AC

## 2020-10-26 NOTE — Telephone Encounter (Addendum)
Submitted PA on Humira to see if it is rejected or if it is covered.

## 2020-10-26 NOTE — Progress Notes (Signed)
Reviewed and agree with management plans. Agree with starting biologic. Would recommend Humira if approved by her insurance.   Ida Uppal L. Tarri Glenn, MD, MPH

## 2020-10-26 NOTE — Patient Instructions (Addendum)
If you are age 32 or younger, your body mass index should be between 19-25. Your Body mass index is 21.72 kg/m. If this is out of the aformentioned range listed, please consider follow up with your Primary Care Provider.   The [facility] providers would like to encourage you to use [facility] to communicate with providers for non-urgent requests or questions.  Due to long hold times on the telephone, sending your provider a message by [facility] may be faster and more efficient way to get a response. Please allow 48 business hours for a response.  Please remember that this is for non-urgent requests/questions.  Your provider has prescribed Magic Mouthwase for you. Please follow the directions written on your prescription bottle or given to you specifically by your provider. Since this is a specialty medication and is not readily available at most local pharmacies, we have sent your prescription to:  [facility] information is below: Address: [address] [place], [place] [address]  [phone]  *Please DO NOT go directly from our office to pick up this medication! Give the pharmacy 1 day to process the prescription as this is compounded and takes time to make.  RECOMMENDATIONS: Continue Entocort 9 MG a day. Increase Lialda 1.2pm 4 tablets a day.  We have scheduled you a follow up with Dr. Tarri Glenn on 12/01/20 at 9:50AM.  It was great seeing you today! Thank you for entrusting me with your care and choosing [facility].  Noralyn Pick, CRNP

## 2020-10-26 NOTE — Progress Notes (Signed)
Melissa Nicholson, I spoke with the patient and she is not sure if she would be able to self administer Humira.  I also discussed Remicade infusions as an alternative anti TNF inhibitor which she seemed more comfortable with but she did not completely rule out Humira. Can you please verify with her insurance carrier if Humira or Remicade are both covered? If one is not covered then she will pursue treatment with whichever biologic is covered.  I appreciate your assistance with this.

## 2020-10-31 NOTE — Telephone Encounter (Signed)
Additional records faxed to [facility].

## 2020-11-05 NOTE — Telephone Encounter (Signed)
Humira was denied by her insurance. If you will put in orders for the Remicade to the infusion center, the pharmacy team will do the benefits investigation for that medication.

## 2020-11-07 ENCOUNTER — Telehealth: Payer: Self-pay | Admitting: Pharmacy Technician

## 2020-11-07 ENCOUNTER — Other Ambulatory Visit: Payer: Self-pay | Admitting: Nurse Practitioner

## 2020-11-07 NOTE — Telephone Encounter (Signed)
Auth Submission: COVER MY MEDS (MEDICAL BENEFIT) Payer: [employer] Medication & CPT/J Code(s) submitted: Remicade (Infliximab) J1745 Route of submission (phone, fax, portal): COVER MY MED Auth type: Buy/Bill Units/visits requested:  Reference number: [id number] -  MEDICAL BENEFIT    Will update once we receive a response.

## 2020-11-07 NOTE — Telephone Encounter (Signed)
[name], I entered the Remicade/Infliximab induction infusion orders in the infusion navigator. Keep me updated on authorization. If approved, I need to discuss Remicade benefits and risks in full detail with the patient. Appreciate your assistance with keeping track of Remicade approval.

## 2020-11-07 NOTE — Telephone Encounter (Signed)
Yes, I will monitor for the outcome on the Remicade. It is possible that the infusion center pharmacy staff will by-pass me and go to you. Keep me posted as well please. I spoke with Melissa Nicholson. Gave her updates on the insurance/biologic search. She has very wisely been reading about her condition and medications on the crohnscolitisfoundation.org web site. She is leaning towards self-administered biologic at this time. She is very apprehensive about all biologics of course. She has a follow up appointment in July with Dr Tarri Glenn. I have assured her of an appointment to discuss prior to starting any biologic.

## 2020-11-07 NOTE — Telephone Encounter (Signed)
Excellent, thank you!

## 2020-11-08 NOTE — Telephone Encounter (Signed)
Dr. Vaughan Browner,  Auth Submission: DENIED (REMICADE) Denied Due To: patient has not been on budesonide and mesalamine at least 3 months and did not work.  Payer: [employer] Medication & CPT/J Code(s) submitted: Remicade (Infliximab) J1745 Route of submission (phone, fax, portal): COVER MY MEDS Auth type: Buy/Bill Units/visits requested Reference number: [id number]

## 2020-11-08 NOTE — Telephone Encounter (Signed)
She has a follow up appointment 12/27/20 with Dr Tarri Glenn.

## 2020-11-09 ENCOUNTER — Other Ambulatory Visit: Payer: Self-pay

## 2020-11-09 ENCOUNTER — Telehealth: Payer: Self-pay | Admitting: Nurse Practitioner

## 2020-11-09 MED ORDER — PREDNISONE 10 MG PO TABS: ORAL_TABLET | ORAL | 0 refills | Status: DC

## 2020-11-09 NOTE — Telephone Encounter (Signed)
Inbound call from pt requesting a call back. Pt stated that she has been having diarrhea since Wednesday. And her Remicade has been approved. Please advise. Thank you

## 2020-11-09 NOTE — Telephone Encounter (Signed)
Okay I think that is really poor judgement from the insurance company. I think we need to appeal this to get her started on biologic therapy sooner, I am happy to do this for Dr. Tarri Glenn while she is out of the office, do they require a peer to peer? Are they recommending another biologic over anti-TNF, or are they refusing all biologic therapy? I would recommend prednisone course and we can start that if she feels like enterocort is not providing enough relief for her. Thanks

## 2020-11-09 NOTE — Telephone Encounter (Signed)
DOD Patient of Dr Lavera Guise, last seen by Carl Best in May. She has Crohn's newly diagnosed. Please review the last office note for details. She is on Lialda and Entocort. Calling today with complains of a return of diarrhea. This started 3 days ago. She is having 3 watery stools a day denies tenesmus, fever and nausea. She is seeing scant blood on the tissue and in the toilet. These things had stopped with the increase in her Lialda until Wednesday. She is not on biologics. Recommendations?

## 2020-11-09 NOTE — Telephone Encounter (Addendum)
I discussed with the patient and explained how she will taper. She understands to stop her budesonide while on the prednisone. She will call me next week with an update of her symptoms.

## 2020-11-09 NOTE — Telephone Encounter (Signed)
Thanks [employer]. Recommend prednisone 40mg  / day for 2 weeks, then taper slowly by decreasing 5mg  / week until done. Hopefully this will cover her until she can get a biologic approved. She can hold the budesonide while she is on this. Thanks

## 2020-11-09 NOTE — Telephone Encounter (Signed)
Melissa Nicholson can you clarify if she has been approved for Remicade? If so hopefully she can start that soon which would make her feel better. How much Enterocort is she on? 9mg   per day or has she tapered down? If she is on low dose we could increase back to 9mg .  If she is already on 9mg  / day and failing that, may need to transition to prednisone 40mg  / day for 2 weeks with slow taper by 5mg  / week until we can get her on more definitive therapy with a biologic. Up to her in regards to when she will start Remicade and how much these symptoms bother her if she wants prednisone in the interim. Can you let me know? Thanks

## 2020-11-15 ENCOUNTER — Other Ambulatory Visit: Payer: Self-pay

## 2020-11-15 NOTE — Telephone Encounter (Signed)
Dr Tarri Glenn, I have routed you a draft letter to appeal the decision from [facility]. I did it quickly and used the template from [employer]. Please review and send it back to me with any changes. I will fax an urgent appeal to [place].

## 2020-11-16 ENCOUNTER — Telehealth: Payer: Self-pay | Admitting: Nurse Practitioner

## 2020-11-16 NOTE — Telephone Encounter (Signed)
Inbound call from pt stating that she woke up with a fever early Saturday about 4:30am and had a tingle feeling in her neck and arms. Cooled down with a wash cloth. Once fever passed she began her Prednisone and she stated she is feeling a lot better. Her diarrhea has improved.

## 2020-11-16 NOTE — Telephone Encounter (Signed)
Letter and supporting records faxed.

## 2020-11-19 ENCOUNTER — Telehealth: Payer: Self-pay

## 2020-11-19 NOTE — Telephone Encounter (Signed)
November 19, 2020   Dr Havery Moros,  Would you help me with this patient again or should it go to Dr Fuller Plan DOD? Dr Tarri Glenn is out of office all week.  JR   3:15 PM Melissa Nicholson routed this conversation to Me    Melissa Nicholson   3:14 PM Note Patient called states since Friday she is feeling like she has a lump in her throat also said she feels like she is not in her body at times thinks it is from the Prednisone but is not sure. Please advise.      Spoke with the patient. She feels improvement in her GI symptoms since starting the prednisone. Diarrhea has improved. She is on the second week of the 40 mg daily. She will taper by 5 mg on the 15th day and then taper by 5mg  every 7 days.  States she is hydrating well. Sometimes she feels her heart pounding.  Does she need to start her taper sooner or continue to monitor? Her follow up appointment with Dr Tarri Glenn is 11/28/20.

## 2020-11-19 NOTE — Telephone Encounter (Signed)
Hi [name], I am happy to help with this. Sounds like the prednisone is helping her bowel symptoms but she may be having some other side effects from it.  I think her bowel will do best if she can complete the full taper of prednisone and continue 40 mg a day for 2 weeks but if she is having palpitations or heart racing with this, she can start tapering by 5 mg every 7 days, up to her.  If the side effects of the prednisone are worsening and she needs or wants to taper it sooner please let me know.  Thanks

## 2020-11-19 NOTE — Telephone Encounter (Signed)
Patient called states since Friday she is feeling like she has a lump in her throat also said she feels like she is not in her body at times thinks it is from the Prednisone but is not sure. Please advise.

## 2020-11-20 NOTE — Telephone Encounter (Signed)
Patient aware of her options. She wants to stay on the prednisone as prescribed. She will let us know if she has further problems.

## 2020-11-23 ENCOUNTER — Other Ambulatory Visit: Payer: Self-pay

## 2020-11-23 MED ORDER — PANTOPRAZOLE SODIUM 40 MG PO TBEC: 40.0000 mg | DELAYED_RELEASE_TABLET | Freq: Every day | ORAL | 0 refills | Status: DC

## 2020-11-28 ENCOUNTER — Other Ambulatory Visit (INDEPENDENT_AMBULATORY_CARE_PROVIDER_SITE_OTHER): Payer: BC Managed Care – PPO

## 2020-11-28 ENCOUNTER — Encounter: Payer: Self-pay | Admitting: Gastroenterology

## 2020-11-28 ENCOUNTER — Ambulatory Visit: Payer: BC Managed Care – PPO | Admitting: Gastroenterology

## 2020-11-28 ENCOUNTER — Other Ambulatory Visit: Payer: Self-pay

## 2020-11-28 VITALS — BP 118/60 | HR 74 | Ht 65.0 in | Wt 127.0 lb

## 2020-11-28 DIAGNOSIS — K50919 Crohn's disease, unspecified, with unspecified complications: Secondary | ICD-10-CM

## 2020-11-28 LAB — BASIC METABOLIC PANEL
BUN: 14 mg/dL (ref 6–23)
CO2: 29 mEq/L (ref 19–32)
Calcium: 8.8 mg/dL (ref 8.4–10.5)
Chloride: 100 mEq/L (ref 96–112)
Creatinine, Ser: 0.93 mg/dL (ref 0.40–1.20)
GFR: 81.48 mL/min (ref >60.00)
Glucose, Bld: 102 mg/dL — ABNORMAL HIGH (ref 70–99)
Potassium: 3.4 mEq/L — ABNORMAL LOW (ref 3.5–5.1)
Sodium: 137 mEq/L (ref 135–145)

## 2020-11-28 MED ORDER — HUMIRA (2 PEN) 40 MG/0.4ML ~~LOC~~ AJKT: 40.0000 mg | AUTO-INJECTOR | SUBCUTANEOUS | 6 refills | Status: DC

## 2020-11-28 MED ORDER — HUMIRA-CD/UC/HS STARTER 80 MG/0.8ML ~~LOC~~ AJKT: AUTO-INJECTOR | SUBCUTANEOUS | 0 refills | Status: DC

## 2020-11-28 NOTE — Patient Instructions (Addendum)
It was my pleasure to provide care to you you today. Based on our discussion, I am providing you with my recommendations below:  RECOMMENDATION(S):   You will receive a call from our Nursing staff to begin Humira treatments  I have recommended Humira to treat your Crohn's disease. Here is some additional information. Let me know if you have any questions regarding this information. - This medication belongs to a class of drugs called biologics. It helps to reduce irritation and swelling (inflammation) in the intestines. In some cases, this medication is used by itself. In other cases, this medication is used together with another medication to achieve better results. - This medication is also considered an anti-TNF drug, which means that it targets a specific protein in your body called tumor necrosis factor (TNF) that causes inflammation in your intestines. - It is given as an injection under the skin of your belly or thigh. The injection process takes about 10 seconds. A doctor or nurse will teach you how to do the injection. Once you learn how to do it yourself, you or a family member can do at home. Your doctor may adjust the dose and how often you receive it, but typically it is given once every 2 weeks. - It may take up to 8 weeks after starting this medication to see an improvement in your symptoms. However, a lot of people see more immediate improvement. - Side effects can include injection site reactions (such as redness, rash, swelling, itching, pain, or bruising), upper respiratory infections (including sinus infections), headaches, and nausea. There have been reports of serious infections, including tuberculosis. Anti-TNF medications have been associated with a small risk of lymphoma, an uncommon cancer. - The best way to control your disease is by taking your medication as directed. Even when you do not have any symptoms, it is very important to continue taking your medication to prevent  your disease from becoming active again. Do not alter the amount of the medication or how frequently you take it on your own. - If you have any side effects or you continue to have symptoms, please call me immediately.  For further information, please check out http://www.ibdmedicationguide.org/ or follow this link:     [email or web address]/files/[email or web address]/[email or web address]/[email or web address]-therapy.[email or web address]   FOLLOW UP:  I would like for you to follow up with me with Carl Best, NP as indicated in the appointments of this After Visit Summary.   BMI:  If you are age 13 or younger, your body mass index should be between 19-25. Your There is no height or weight on file to calculate BMI. If this is out of the aformentioned range listed, please consider follow up with your Primary Care Provider.   MY CHART:  The [facility] providers would like to encourage you to use [facility] to communicate with providers for non-urgent requests or questions.  Due to long hold times on the telephone, sending your provider a message by [facility] may be a faster and more efficient way to get a response.  Please allow 48 business hours for a response.  Please remember that this is for non-urgent requests.   Thank you for trusting me with your gastrointestinal care!    Thornton Park, MD, MPH

## 2020-11-28 NOTE — Progress Notes (Signed)
Referring Provider: Dineen Kid, MD Primary Care Physician:  Dineen Kid, MD  Chief complaint:  Crohn's disease   IMPRESSION:  Ileocolonic Crohn's disease with perianal fistula: Disease progression despite Lialda and budesonide. Some improvement on prednisone but still with significant blood in the stool. Discussed treatment options with goal of achieving remission. She was resistant to Humira given risks. We discussed the risk profile and the benefits to achieving remission for overall health. Will start Humira. Continue steroid taper.   Prednisone-induced dyspepsia: Clinically improving on pantoprazole 40 mg QAM.  Anal fissures s/p chemical sphincterotomy with injection of perianal Botox on 06/21/2020  PLAN: BMP to monitor for electrolyte abnormalities related to diarrhea and prednisone Start Humira - provided phone number for Humira Ambassador Continue prednisone taper by 5mg  every week Follow fecal calprotectin Avoid all NSAIDs Pneumovax recommended Calcium and vitamin D supplementations Annual dermatology head-to-toe exam Follow-up in 8 weeks, earlier if needed Briefly discussed family planning strategies   Please see the "Patient Instructions" section for addition details about the plan.  HPI: Melissa Nicholson is a 30 y.o. female who returns in follow-up for new diagnosis of Crohn's.  Recommended biologics for fistulizing disease. Insurance denied prior to failing treatment with Lialda and Entocort.  Required prednisone starting earlier this month for progressive symptoms.  Currently on prednisone 35mg  daily. Reducing by 5 mg every 7 days. Having a rare day with no BM but having 1-3 most days. Urgency in the morning. At it's worst it was 4 water bowel movements daily.   Stools are more formed on prednisone. Seeing more blood over the last week. No mucous or discharge. Energy is good. Appetite is good.   Started pantoprazole 40 mg for dyspepsia related to prednisone. Some out  of body experience on prednisone and intermittient dysphagia.  No other specific complaints or concerns at this time.   IBD HISTORY:   Year of disease onset: 2022  Brief IBD Disease Course:  - 2022 - onset of perianal symptoms and diarrhea. Imaging and CT confirmed Crohn's. Fecal calprotectin 673 10/02/20. Insurance denied biologics. Started on Lialda and budeonside. No improvement. Required prednisone. Humira recently approved.    Endoscopy:  - Colonoscopy 09/19/20 - inflammation throughout the entire colon except for the distal sigmoid and rectum.  Biopsies of the right colon showed ulcer with moderate active colitis, transverse colon with focal active colitis, left colon and rectum without significant pathologic findings.  The TI was normal.  The pathology report was consistent with IBD. PATH = IBD  Imaging:  - Pelvic MRI w/wo contrast 09/03/2020: Small perianal fistula suggested arising from the inferior aspect of the sphincter complex the immediate perianal soft tissues, extending inferiorly and potentially into the gluteal cleft perhaps slightly more on the RIGHT than the LEFT. No drainable fluid. Signs of ileitis with ascites and interloop fluid of unclear etiology. Given constellation of findings would consider the possibility of inflammatory bowel disease. Ascites in the pelvis. - CT-E 09/12/20 - fairly marked wall thickening to the distal and terminal ileum suggestive of active Crohn's disease  Prior IBD medications (type, dose, duration, response): x 5-ASAs - Lialda x Oral corticosteroids - Prednisone, budesonide ? Intravenous corticosteroids x Antibiotics - Cipro and Flagyl ? Thiopurines  ? Methotrexate  x Anti-TNF therapies - Humira approved ? Anti-Interleukin therapies  ? Cyclosporine ? Clinical trial medication ? Other (Please specify):  IBD health maintenance: Influenza vaccine: Receives annually Pneumonia vaccine: Never Hepatitis B: Completed for vaccine TB testing:  QuantiFERON 10/02/20 Chickenpox/Shingles  history: Chicken pox in preschool Bone denistometry: None Derm appointment: Dermatologist, previously PRN Last small bowel imaging: 08/2020 Last colonoscopy: 08/2020 Not considering family planning for another 2-3 years   Extraintestinal manifestations:  -joint pains affecting: Bilateral knees, legs, and feet -eye: n -skin: n -oral ulcers : Previously, last 5/22 -blood clots: n -PSC: n -other: n     Past Medical History:  Diagnosis Date   Anxiety    Chronic anal fissure    COVID 06/2019   sob, chest pain loss of taste and smell fever coughx 1 week all symptoms resolved   Wears glasses     Past Surgical History:  Procedure Laterality Date   COLONOSCOPY     lumps removed from both breasts Bilateral 2011   fibrous adenomas benign   RECTAL EXAM UNDER ANESTHESIA N/A 06/21/2020   Procedure: ANORECTAL EXAM UNDER ANESTHESIA. INJECTION OF ANAL BOTOX;  Surgeon: Ileana Roup, MD;  Location: [facility];  Service: General;  Laterality: N/A;   WISDOM TOOTH EXTRACTION      Current Outpatient Medications  Medication Sig Dispense Refill   citalopram (CELEXA) 20 MG tablet Take 20 mg by mouth daily.     ELDERBERRY PO Take by mouth. daily     LO LOESTRIN FE 1 MG-10 MCG / 10 MCG tablet Take 1 tablet by mouth daily.     mesalamine (LIALDA) 1.2 g EC tablet Take 4 tablets a day. 360 tablet 1   pantoprazole (PROTONIX) 40 MG tablet Take 1 tablet (40 mg total) by mouth daily before breakfast. Take 30 minutes before meal 90 tablet 0   predniSONE (DELTASONE) 10 MG tablet 40mg  x 14days then taper by 5mg  every 7 days then stop 154 tablet 0   Adalimumab (HUMIRA PEN) 40 MG/0.4ML PNKT Inject 40 mg into the skin every 14 (fourteen) days. Begin day 29 2 each 6   budesonide (ENTOCORT EC) 3 MG 24 hr capsule Take 3 capsules (9 mg total) by mouth every morning. (Patient not taking: Reported on 11/28/2020) 90 capsule 2   HUMIRA PEN-CD/UC/HS STARTER  80 MG/0.8ML PNKT SQ injection 2 pens day 1 then SQ injection 1 pen day 15 3 each 0   No current facility-administered medications for this visit.    Allergies as of 11/28/2020 - Review Complete 11/28/2020  Allergen Reaction Noted   Crab (diagnostic)  06/19/2020    Family History  Problem Relation Age of Onset   Heart block Father    Colon cancer Paternal Grandmother    Stomach cancer Neg Hx    Esophageal cancer Neg Hx    Pancreatic cancer Neg Hx    Rectal cancer Neg Hx      Physical Exam: General:   Alert,  well-nourished, pleasant and cooperative in NAD Head:  Normocephalic and atraumatic. Eyes:  Sclera clear, no icterus.   Conjunctiva pink. Abdomen:  Soft,nontender, nondistended, normal bowel sounds, no rebound or guarding. No hepatosplenomegaly.   Rectal:   No chemical dermatitis. Persistent anterior fissure and posterior fissures with overlying yellow exudate, small fistula orifice to the right posterior anal area. No exudate.  Neurologic:  Alert and  oriented x4;  grossly nonfocal Skin:  Intact without significant lesions or rashes. Psych:  Alert and cooperative. Normal mood and affect.     Eulan Heyward L. Tarri Glenn, MD, MPH 11/28/2020, 1:09 PM

## 2020-11-29 ENCOUNTER — Other Ambulatory Visit: Payer: Self-pay | Admitting: *Deleted

## 2020-11-29 DIAGNOSIS — K50919 Crohn's disease, unspecified, with unspecified complications: Secondary | ICD-10-CM

## 2020-11-29 MED ORDER — POTASSIUM CHLORIDE CRYS ER 20 MEQ PO TBCR: EXTENDED_RELEASE_TABLET | ORAL | 0 refills | Status: DC

## 2020-11-30 ENCOUNTER — Other Ambulatory Visit: Payer: Self-pay

## 2020-11-30 DIAGNOSIS — U071 COVID-19: Secondary | ICD-10-CM

## 2020-11-30 HISTORY — DX: COVID-19: U07.1

## 2020-11-30 MED ORDER — HUMIRA-CD/UC/HS STARTER 80 MG/0.8ML ~~LOC~~ AJKT: AUTO-INJECTOR | SUBCUTANEOUS | 0 refills | Status: DC

## 2020-11-30 MED ORDER — HUMIRA (2 PEN) 40 MG/0.4ML ~~LOC~~ AJKT: 40.0000 mg | AUTO-INJECTOR | SUBCUTANEOUS | 6 refills | Status: DC

## 2020-12-04 ENCOUNTER — Other Ambulatory Visit (INDEPENDENT_AMBULATORY_CARE_PROVIDER_SITE_OTHER): Payer: BC Managed Care – PPO

## 2020-12-04 ENCOUNTER — Other Ambulatory Visit: Payer: Self-pay

## 2020-12-04 DIAGNOSIS — K50919 Crohn's disease, unspecified, with unspecified complications: Secondary | ICD-10-CM | POA: Diagnosis not present

## 2020-12-04 LAB — BASIC METABOLIC PANEL
BUN: 11 mg/dL (ref 6–23)
CO2: 30 mEq/L (ref 19–32)
Calcium: 8.6 mg/dL (ref 8.4–10.5)
Chloride: 100 mEq/L (ref 96–112)
Creatinine, Ser: 0.9 mg/dL (ref 0.40–1.20)
GFR: 84.74 mL/min (ref >60.00)
Glucose, Bld: 126 mg/dL — ABNORMAL HIGH (ref 70–99)
Potassium: 4 mEq/L (ref 3.5–5.1)
Sodium: 138 mEq/L (ref 135–145)

## 2020-12-14 ENCOUNTER — Ambulatory Visit: Payer: BC Managed Care – PPO | Admitting: Nurse Practitioner

## 2020-12-14 ENCOUNTER — Encounter: Payer: Self-pay | Admitting: Nurse Practitioner

## 2020-12-14 DIAGNOSIS — K5010 Crohn's disease of large intestine without complications: Secondary | ICD-10-CM | POA: Diagnosis not present

## 2020-12-14 DIAGNOSIS — K50119 Crohn's disease of large intestine with unspecified complications: Secondary | ICD-10-CM

## 2020-12-14 NOTE — Progress Notes (Signed)
12/14/2020 Kestrel Mis [id number] [birth year]   Chief Complaint: Crohn's disease, passing brown water from the rectum  History of Present Illness: Melissa Nicholson is a 30 year old female with a past medical history of anxiety, Covid 19 infection and an fissures s/p chemical sphincterotomy with injection of perianal Botox on 06/21/2020. I initially saw Melissa in office on 08/27/2020 due to having loose stools 4 days weekly and persistent anal fissure pain and bleeding.  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 Cipro 500 mg p.o. twice daily and Metronidazole 500 mg p.o. twice daily was ordered but was discontinued after few days says she developed diarrhea and worse anal rectal discomfort after taking the antibiotics. A pelvic MRI was done 09/03/2020 which identified a small perianal fistula.  A small bowel CT enterography 09/12/2020 showed fairly marked wall thickening to the distal and terminal ileum suggestive of active Crohn's disease.  She was started on mesalamine 1.2 g 4 tabs daily Budesonide 9 mg po daily.  She underwent a colonoscopy by Dr. Tarri Glenn on 09/19/2020 which identified inflammation throughout the entire colon except for the distal sigmoid and rectum.  Biopsies of the right colon showed ulcer with moderate active colitis, transverse colon with focal active colitis, left colon and rectum without significant pathologic findings.  Budesonide was discontinued and she was started on Prednisone taper and the authorization process for Humira was initiated.  She was seen in office by Dr. Tarri Glenn on 11/28/2020 for further follow-up and to initiate Humira.  She started Humira induction dose 144mcg [place] on 12/06/2020. Her next induction dose of Humira 80mg  Elim is due on 12/20/2020. She continues to taper down Prednisone, currently on Prednisone 25mg  po QD x 7 days with  instructions to taper down by 5 mg every 7 days until off.  Since starting Humira she feels her stools are firmer and she is having less anal rectal bleeding and pain.  She is passing soft small formed stools 2-3 times daily.  She describes leaking watery brown liquid from the rectum which is a new development.  When she goes to the bathroom to urinate she wipes front then to the back anal area and sees a scant amount of watery brown discharge on the toilet tissue and a scant amount of blood.  No purulent anorectal discharge.  No fever.  She reports having night sweats 3-4 nights weekly for at least the past 6 months which she attributes to using a heavy blanket at night.  Has noticed a slight scratchy throat within dry cough since starting Humira as well.  No chest pain or shortness of breath.  No other complaints at this time.    Current Outpatient Medications on File Prior to Visit  Medication Sig Dispense Refill   citalopram (CELEXA) 20 MG tablet Take 20 mg by mouth daily.     ELDERBERRY PO Take by mouth. daily     HUMIRA PEN-CD/UC/HS STARTER 80 MG/0.8ML PNKT SQ injection 2 pens day 1 then SQ injection 1 pen day 15 3 each 0   LO LOESTRIN FE 1 MG-10 MCG / 10 MCG tablet Take 1 tablet by mouth daily.     mesalamine (LIALDA) 1.2 g EC tablet Take 4 tablets a day. 360 tablet 1   pantoprazole (PROTONIX) 40 MG tablet Take 1 tablet (40 mg total) by mouth daily before breakfast. Take  30 minutes before meal 90 tablet 0   predniSONE (DELTASONE) 10 MG tablet 40mg  x 14days then taper by 5mg  every 7 days then stop 154 tablet 0   Adalimumab (HUMIRA PEN) 40 MG/0.4ML PNKT Inject 40 mg into the skin every 14 (fourteen) days. Begin day 29 (Patient not taking: Reported on 12/14/2020) 2 each 6   budesonide (ENTOCORT EC) 3 MG 24 hr capsule Take 3 capsules (9 mg total) by mouth every morning. (Patient not taking: No sig reported) 90 capsule 2   No current facility-administered medications on file prior to visit.    Allergies  Allergen Reactions   Crab (Diagnostic)     Swelling of eyes    Current Medications, Allergies, Past Medical History, Past Surgical History, Family History and Social History were reviewed in [employer] record.   Review of Systems:   Constitutional: See HPI. Respiratory: See HPI. Cardiovascular: Negative for chest pain, palpitations and leg swelling.  Gastrointestinal: See HPI.  Musculoskeletal: Negative for back pain or muscle aches.  Neurological: Negative for dizziness, headaches or paresthesias.    Physical Exam: BP 114/66   Pulse 81   Ht 5\' 4"  (1.626 m)   Wt 127 lb (57.6 kg)   BMI 21.80 kg/m   General: 30 year old female in no acute distress. Mouth: Dentition intact. No ulcers or lesions.  Steer pharynx without erythema or exudate. Lungs: Clear throughout to auscultation. Heart: Regular rate and rhythm, no murmur. Abdomen: Soft, nontender and nondistended. No masses or hepatomegaly. Normal bowel sounds x 4 quadrants.  Rectal: Wide posterior fissure with a small amount of suspected dark food matter was removed, a small amount of light watery stool tinged fluid leaked from the anal opening, no purulent drainage or obvious abscess, suspect draining fistula.  CMA Melissa present during exam Musculoskeletal: Symmetrical with no gross deformities. Extremities: No edema. Neurological: Alert oriented x 4. No focal deficits.  Psychological: Alert and cooperative. Normal mood and affect  Assessment and Recommendations:  39.  30 year old female with ileocolonic anal Crohn's disease.  Humira induction initiated on 12/06/2020.  Patient presents for further evaluation regarding brown watery rectal leakage which I suspect is from a draining fistula.  No purulent drainage.  No fever or chills.  Her anal rectal pain and bleeding have decreased therefore it is less likely she has an anorectal abscess at this time. -Patient to proceed with Humira  induction, next dose due on 12/20/2020. -Continue Mesalamine 1.2 g 4 tabs p.o. daily -Continue Prednisone taper, decrease prednisone by 5 mg every 7 days until off -Patient to call our office rectal fluid leakage increases or worsen -Patient to call our office if her scratchy throat or cough worsens -Follow-up in 4 to 6 weeks

## 2020-12-14 NOTE — Patient Instructions (Addendum)
If you are age 32 or younger, your body mass index should be between 19-25. Your Body mass index is 21.8 kg/m. If this is out of the aformentioned range listed, please consider follow up with your Primary Care Provider.   The [place] [facility] providers would like to encourage you to use [facility] to communicate with providers for non-urgent requests or questions.  Due to long hold times on the telephone, sending your provider a message by [facility] may be faster and more efficient way to get a response. Please allow 48 business hours for a response.  Please remember that this is for non-urgent requests/questions.  Continue Humira. Keep your follow up in August. Call us if rectal fluid discharge persists or worsens.  It was great seeing you today! Thank you for entrusting me with your care and choosing [facility].  Noralyn Pick, CRNP

## 2020-12-17 NOTE — Progress Notes (Signed)
Reviewed and agree with management plans. ? ?Pearley Baranek L. Flay Ghosh, MD, MPH  ?

## 2020-12-27 ENCOUNTER — Other Ambulatory Visit: Payer: Self-pay | Admitting: Gastroenterology

## 2020-12-27 ENCOUNTER — Ambulatory Visit: Payer: BC Managed Care – PPO | Admitting: Gastroenterology

## 2020-12-28 ENCOUNTER — Other Ambulatory Visit: Payer: Self-pay | Admitting: Gastroenterology

## 2020-12-30 ENCOUNTER — Telehealth: Payer: Self-pay | Admitting: Nurse Practitioner

## 2020-12-30 NOTE — Telephone Encounter (Signed)
Patient followed by Dr. Tarri Glenn for history of Crohn's disease.  She takes Humira.  She called the answering service this morning.  Husband has COVID, patient recently developed symptoms and this morning went to urgent care where she also tested positive for COVID.  She was given a prescription for [employer] and wants to know if it is okay to take it.  Advised her that she could take [employer] as needed for cough.  I hasked her to call the office Monday to find out if Dr. Tarri Glenn wants to postpone next Humira injection on Thursday given that she has symptomatic COVID.

## 2020-12-31 ENCOUNTER — Telehealth: Payer: Self-pay | Admitting: Nurse Practitioner

## 2020-12-31 DIAGNOSIS — R5383 Other fatigue: Secondary | ICD-10-CM | POA: Diagnosis not present

## 2020-12-31 DIAGNOSIS — R0981 Nasal congestion: Secondary | ICD-10-CM | POA: Diagnosis not present

## 2020-12-31 DIAGNOSIS — R051 Acute cough: Secondary | ICD-10-CM | POA: Diagnosis not present

## 2020-12-31 DIAGNOSIS — U071 COVID-19: Secondary | ICD-10-CM | POA: Diagnosis not present

## 2020-12-31 NOTE — Telephone Encounter (Signed)
Lauren is now Colleen's nurse please send to her thank you.

## 2020-12-31 NOTE — Telephone Encounter (Signed)
Discussed recommendations with patient by phone. She will reach out to PCP re: Paxlovid. I think she is a candidate given her chronic prednisone use. She will reduce prednisone to 5 mg daily 01/03/21. Take Humira 01/03/21 if she continues to have clinical improvement.  KLB

## 2020-12-31 NOTE — Telephone Encounter (Signed)
Challenging situation. Hopefully she was given Paxlovid. Will want to continue steroid taper. Data supports continuing Humira as long as Covid symptoms are improving.   I called [place] to discuss these recommendations with her. I went to voicemail and left a message asking her to call the office.

## 2020-12-31 NOTE — Telephone Encounter (Signed)
Patient called she is requesting a refill on Prednisone for 3 more days.

## 2020-12-31 NOTE — Telephone Encounter (Signed)
Hey Dr. Tarri Glenn,   Patient returned call. Best contact number [phone]

## 2021-01-01 MED ORDER — PREDNISONE 5 MG PO TABS: ORAL_TABLET | ORAL | 0 refills | Status: DC

## 2021-01-01 NOTE — Telephone Encounter (Signed)
Attempted to contact patient, no answer. LMTCB Rx sent to pharmacy.

## 2021-01-09 ENCOUNTER — Other Ambulatory Visit: Payer: Self-pay | Admitting: Gastroenterology

## 2021-01-15 DIAGNOSIS — J40 Bronchitis, not specified as acute or chronic: Secondary | ICD-10-CM | POA: Diagnosis not present

## 2021-01-29 ENCOUNTER — Encounter: Payer: Self-pay | Admitting: Nurse Practitioner

## 2021-01-29 ENCOUNTER — Ambulatory Visit: Payer: BC Managed Care – PPO | Admitting: Nurse Practitioner

## 2021-01-29 VITALS — BP 110/62 | HR 89 | Ht 65.0 in | Wt 133.0 lb

## 2021-01-29 DIAGNOSIS — K5010 Crohn's disease of large intestine without complications: Secondary | ICD-10-CM

## 2021-01-29 DIAGNOSIS — K603 Anal fistula: Secondary | ICD-10-CM | POA: Diagnosis not present

## 2021-01-29 DIAGNOSIS — K602 Anal fissure, unspecified: Secondary | ICD-10-CM | POA: Diagnosis not present

## 2021-01-29 NOTE — Progress Notes (Signed)
01/29/2021 Melissa Nicholson Nicholson [id number] [birth year]   Chief Complaint: Crohn's disease follow-up  History of Present Illness:  Melissa Nicholson Nicholson is a 30 year old Nicholson with a past medical history of anxiety, Covid 19 infection and an fissures s/p chemical sphincterotomy with injection of perianal Botox on 06/21/2020.  Refer to her last office follow-up consult note 12/14/2020 for updated history since being diagnosed with Crohn's disease.  She underwent a colonoscopy by Dr. Tarri Glenn on 09/19/2020 which identified inflammation throughout the entire colon except for the distal sigmoid and rectum.  Biopsies of the right colon showed ulcer with moderate active colitis, transverse colon with focal active colitis, left colon and rectum without significant pathologic findings.  Budesonide was discontinued and she was started on Prednisone taper and she initiated Humira on 12/06/2020.  She presents to our office today for further follow-up.  She has completely weaned off Prednisone.  She remains on Humira 40 mg subcu every 2 weeks.  Her next Humira dose is due on Thursday 02/07/2021.  She remains on Mesalamine 4.8 g p.o. daily.  She is no longer taking Pantoprazole.  She denies having any upper or lower abdominal pain.  No anorectal pain.  No rectal bleeding.  Her anal fluid drainage has significantly decreased but has not completely abated.  No purulent anal drainage but continues to pass a scant amount of watery brown drainage which is noticeable when she wipes after going to the bathroom to urinate or to pass a BM.  She is passing several soft to loose bowel movements daily.  Her stools are not as solid and she questions if this might be due to eating more ice cream.  She is exercising and back to playing tennis again.  She has some joint pain under the buttocks which was present prior to Humira and has not worsened since starting it.  She tested positive for COVID the end of July 2022, she had a minor cough  treated with Tessalon without further complications.   Current Outpatient Medications on File Prior to Visit  Medication Sig Dispense Refill   Adalimumab (HUMIRA PEN) 40 MG/0.4ML PNKT Inject 40 mg into the skin every 14 (fourteen) days. Begin day 29 2 each 6   citalopram (CELEXA) 20 MG tablet Take 20 mg by mouth daily.     ELDERBERRY PO Take by mouth. daily     LO LOESTRIN FE 1 MG-10 MCG / 10 MCG tablet Take 1 tablet by mouth daily.     mesalamine (LIALDA) 1.2 g EC tablet TAKE 2 TABLETS BY MOUTH DAILY WITH BREAKFAST. (Patient taking differently: 4.8 g.) 180 tablet 1   No current facility-administered medications on file prior to visit.   Allergies  Allergen Reactions   Crab (Diagnostic)     Swelling of eyes    Current Medications, Allergies, Past Medical History, Past Surgical History, Family History and Social History were reviewed in [employer] record.   Review of Systems:   Constitutional: Negative for fever, sweats, chills or weight loss.  Respiratory: Negative for shortness of breath.   Cardiovascular: Negative for chest pain, palpitations and leg swelling.  Gastrointestinal: See HPI.  Musculoskeletal: Negative for back pain or muscle aches.  Neurological: Negative for dizziness, headaches or paresthesias.    Physical Exam: BP 110/62   Pulse 89   Ht '5\' 5"'$  (1.651 m)   Wt 133 lb (60.3 kg)   SpO2 99%   BMI 22.13 kg/m  General: 30 year old Nicholson in  no acute distress. Head: Normocephalic and atraumatic. Eyes: No scleral icterus. Conjunctiva pink . Lungs: Clear throughout to auscultation. Heart: Regular rate and rhythm, no murmur. Abdomen: Soft, nontender and nondistended. No masses or hepatomegaly. Normal bowel sounds x 4 quadrants.  Rectal: Patient requested repeat rectal exam for reassurance.  Rectal exam showed posterior open fissure which appears less deep with less erythema but had a scant amount of watery drainage was was wiped away with a  tissue, and anterior fissure with a thickened sentinel tag stable without active bleeding.  No external fistula assessed.  CMA Melissa Nicholson present during exam. Musculoskeletal: Symmetrical with no gross deformities. Extremities: No edema. Neurological: Alert oriented x 4. No focal deficits.  Psychological: Alert and cooperative. Normal mood and affect  Assessment and Recommendations:  56.  30 year old Nicholson with ileocolonic anal Crohn's disease.  Humira induction initiated on 12/06/2020.  Remains on Mesalamine 4.8 g p.o. daily.  Off Prednisone. -Continue Humira 40 mg SQ twice daily -Continue Mesalamine 4.8 g p.o. daily -Patient to monitor symptoms, I discussed her anal rectal fluid discharge will likely continue to improve as she continues with Humira therapy -She will call our office if she develops increased anorectal fluid or purulent anorectal discharge -Follow-up in the office with Dr. Tarri Glenn in 2 months, as her Crohn's symptoms hopefully continue to improve we will stretch out her interval of follow-up appointments.  At this time, she is appreciative of the close follow-up and needing reassurance. -Patient to call our office if she develops any infectious process  2.  COVID-19 infection [id number], uncomplicated course

## 2021-01-29 NOTE — Patient Instructions (Addendum)
If you are age 32 or younger, your body mass index should be between 19-25. Your Body mass index is 22.13 kg/m. If this is out of the aformentioned range listed, please consider follow up with your Primary Care Provider.   The [place] [facility] providers would like to encourage you to use [facility] to communicate with providers for non-urgent requests or questions.  Due to long hold times on the telephone, sending your provider a message by [facility] may be faster and more efficient way to get a response. Please allow 48 business hours for a response.  Please remember that this is for non-urgent requests/questions.  RECOMMENDATIONS: Follow up with your primary care provider for flu shot and pneumonia shot. Continue Humira every 2 weeks as prescribed.  We have scheduled you a follow up with Dr. Tarri Glenn on 03/19/21 at 3:00pm.   It was great seeing you today! Thank you for entrusting me with your care and choosing [facility].  Noralyn Pick, CRNP

## 2021-01-30 NOTE — Progress Notes (Signed)
Reviewed and agree with management plans. ? ?Seynabou Fults L. Shalane Florendo, MD, MPH  ?

## 2021-02-13 ENCOUNTER — Other Ambulatory Visit: Payer: Self-pay

## 2021-02-13 DIAGNOSIS — R1084 Generalized abdominal pain: Secondary | ICD-10-CM

## 2021-02-13 DIAGNOSIS — K5010 Crohn's disease of large intestine without complications: Secondary | ICD-10-CM

## 2021-02-13 DIAGNOSIS — K529 Noninfective gastroenteritis and colitis, unspecified: Secondary | ICD-10-CM

## 2021-02-13 NOTE — Telephone Encounter (Signed)
Please arrange for fecal calprotectin, ESR, CRP, CBC with diff, and adalimumab levels and antibodies.  I do not think that her chest pressure would be related to Humira. I recommend that she seek evaluation through her PCP to determine the source and appropriate treatments.   I am leaving on vacation. Does Melissa Nicholson have any availability to see her next week? If not, please put her on a cancellation list.   Thank you.

## 2021-02-14 ENCOUNTER — Other Ambulatory Visit (INDEPENDENT_AMBULATORY_CARE_PROVIDER_SITE_OTHER): Payer: BC Managed Care – PPO

## 2021-02-14 DIAGNOSIS — K529 Noninfective gastroenteritis and colitis, unspecified: Secondary | ICD-10-CM

## 2021-02-14 DIAGNOSIS — K5010 Crohn's disease of large intestine without complications: Secondary | ICD-10-CM | POA: Diagnosis not present

## 2021-02-14 DIAGNOSIS — S29011A Strain of muscle and tendon of front wall of thorax, initial encounter: Secondary | ICD-10-CM | POA: Diagnosis not present

## 2021-02-14 DIAGNOSIS — Z23 Encounter for immunization: Secondary | ICD-10-CM | POA: Diagnosis not present

## 2021-02-14 DIAGNOSIS — R1084 Generalized abdominal pain: Secondary | ICD-10-CM | POA: Diagnosis not present

## 2021-02-14 DIAGNOSIS — R0789 Other chest pain: Secondary | ICD-10-CM | POA: Diagnosis not present

## 2021-02-14 LAB — CBC WITH DIFFERENTIAL/PLATELET
Basophils Absolute: 0 10*3/uL (ref 0.0–0.1)
Basophils Relative: 0.7 % (ref 0.0–3.0)
Eosinophils Absolute: 0.1 10*3/uL (ref 0.0–0.7)
Eosinophils Relative: 1.7 % (ref 0.0–5.0)
HCT: 38.9 % (ref 36.0–46.0)
Hemoglobin: 12.8 g/dL (ref 12.0–15.0)
Lymphocytes Relative: 43.7 % (ref 12.0–46.0)
Lymphs Abs: 1.9 10*3/uL (ref 0.7–4.0)
MCHC: 32.8 g/dL (ref 30.0–36.0)
MCV: 89.1 fl (ref 78.0–100.0)
Monocytes Absolute: 0.3 10*3/uL (ref 0.1–1.0)
Monocytes Relative: 6.1 % (ref 3.0–12.0)
Neutro Abs: 2.1 10*3/uL (ref 1.4–7.7)
Neutrophils Relative %: 47.8 % (ref 43.0–77.0)
Platelets: 181 10*3/uL (ref 150.0–400.0)
RBC: 4.36 Mil/uL (ref 3.87–5.11)
RDW: 15 % (ref 11.5–15.5)
WBC: 4.4 10*3/uL (ref 4.0–10.5)

## 2021-02-14 LAB — SEDIMENTATION RATE: Sed Rate: 7 mm/hr (ref 0–20)

## 2021-02-14 LAB — C-REACTIVE PROTEIN: CRP: <1 mg/dL (ref 0.5–20.0)

## 2021-02-15 ENCOUNTER — Other Ambulatory Visit: Payer: BC Managed Care – PPO

## 2021-02-15 DIAGNOSIS — K529 Noninfective gastroenteritis and colitis, unspecified: Secondary | ICD-10-CM | POA: Diagnosis not present

## 2021-02-15 DIAGNOSIS — R1084 Generalized abdominal pain: Secondary | ICD-10-CM | POA: Diagnosis not present

## 2021-02-15 DIAGNOSIS — K5010 Crohn's disease of large intestine without complications: Secondary | ICD-10-CM | POA: Diagnosis not present

## 2021-02-15 NOTE — Progress Notes (Unsigned)
l °

## 2021-02-18 ENCOUNTER — Other Ambulatory Visit: Payer: Self-pay | Admitting: Gastroenterology

## 2021-02-20 ENCOUNTER — Ambulatory Visit: Payer: BC Managed Care – PPO | Admitting: Nurse Practitioner

## 2021-02-20 LAB — CALPROTECTIN: Calprotectin: 38 mcg/g

## 2021-02-22 DIAGNOSIS — Z3040 Encounter for surveillance of contraceptives, unspecified: Secondary | ICD-10-CM | POA: Diagnosis not present

## 2021-02-22 DIAGNOSIS — Z6822 Body mass index (BMI) 22.0-22.9, adult: Secondary | ICD-10-CM | POA: Diagnosis not present

## 2021-02-22 DIAGNOSIS — Z01419 Encounter for gynecological examination (general) (routine) without abnormal findings: Secondary | ICD-10-CM | POA: Diagnosis not present

## 2021-02-22 LAB — ADALIMUMAB+AB (SERIAL MONITOR)
Adalimumab Drug Level: 11 ug/mL
Anti-Adalimumab Antibody: <25 ng/mL

## 2021-03-15 DIAGNOSIS — Z1231 Encounter for screening mammogram for malignant neoplasm of breast: Secondary | ICD-10-CM | POA: Diagnosis not present

## 2021-03-19 ENCOUNTER — Ambulatory Visit: Payer: BC Managed Care – PPO | Admitting: Gastroenterology

## 2021-03-19 ENCOUNTER — Encounter: Payer: Self-pay | Admitting: Gastroenterology

## 2021-03-19 VITALS — BP 100/70 | HR 75 | Ht 64.5 in | Wt 134.0 lb

## 2021-03-19 DIAGNOSIS — K602 Anal fissure, unspecified: Secondary | ICD-10-CM | POA: Diagnosis not present

## 2021-03-19 DIAGNOSIS — Z23 Encounter for immunization: Secondary | ICD-10-CM | POA: Diagnosis not present

## 2021-03-19 DIAGNOSIS — K603 Anal fistula: Secondary | ICD-10-CM

## 2021-03-19 DIAGNOSIS — K50119 Crohn's disease of large intestine with unspecified complications: Secondary | ICD-10-CM

## 2021-03-19 MED ORDER — HUMIRA (2 PEN) 40 MG/0.4ML ~~LOC~~ AJKT
40.0000 mg | AUTO-INJECTOR | SUBCUTANEOUS | 6 refills | Status: DC
Start: 2021-03-19 — End: 2021-11-18

## 2021-03-19 NOTE — Patient Instructions (Addendum)
It was my pleasure to provide care to you today. Based on our discussion, I am providing you with my recommendations below:  RECOMMENDATION(S):   PRESCRIPTION MEDICATION(S):   We have sent the following medication(s) to your pharmacy:  Humira  NOTE: If your medication(s) requires a PRIOR AUTHORIZATION, we will receive notification from your pharmacy. Once received, the process to submit for approval may take up to 7-10 business days. You will be contacted about any denials we have received from your insurance company as well as alternatives recommended by your provider.  LABS:   Please proceed to the basement level for lab work before leaving today. Press "B" on the elevator. The lab is located at the first door on the left as you exit the elevator.  HEALTHCARE LAWS AND MY CHART RESULTS:   Due to recent changes in healthcare laws, you may see results of your imaging and/or laboratory studies on MyChart before I have had a chance to review them.  I understand that in some cases there may be results that are confusing or concerning to you. Please understand that not all results are received at same time and often I may need to interpret multiple results in order to provide you with the best plan of care or course of treatment. Therefore, I ask that you please give me 48 hours to thoroughly review all your results before contacting my office for clarification.    FOLLOW UP:  I would like for you to follow up with me in January 2023. Please call the office at [phone] to schedule your appointment.  BMI:  If you are age 2 or younger, your body mass index should be between 19-25. Your There is no height or weight on file to calculate BMI. If this is out of the aformentioned range listed, please consider follow up with your Primary Care Provider.   MY CHART:  The [place] [facility] providers would like to encourage you to use [facility] to communicate with providers for non-urgent requests or  questions.  Due to long hold times on the telephone, sending your provider a message by [facility] may be a faster and more efficient way to get a response.  Please allow 48 business hours for a response.  Please remember that this is for non-urgent requests.   Thank you for trusting me with your gastrointestinal care!    Thornton Park, MD, MPH

## 2021-03-19 NOTE — Progress Notes (Signed)
Referring Provider: Dineen Kid, MD Primary Care Physician:  Melissa Kid, MD  Chief complaint:  Crohn's disease   IMPRESSION:  Ileocolonic Crohn's disease with perianal fistula: Started Humira 12/06/2020.  Remains on mesalamine 4.8 g daily.  Off prednisone.  Must consider addition of thiopurine.   Prednisone-induced dyspepsia: Improved on prednisone. .  Anal fissures s/p chemical sphincterotomy with injection of perianal Botox on 06/21/2020  PLAN: Pneumovax today Already completed her flu vaccine CBC every 3 months Fecal calprotectin to monitor response to therapy Continue Lialda 4.8 g daily and Humira 40 mg dosed every 2 weeks Calcium and vitamin D supplementations Annual dermatology head-to-toe exam scheduled in December Follow-up in 3 months, earlier if needed   Please see the "Patient Instructions" section for addition details about the plan.  HPI: Melissa Nicholson is a 30 y.o. female who returns in follow-up for a relatively new diagnosis of Crohn's diagnosed on colonoscopy 09/19/20.  Recommended biologics for fistulizing disease. Insurance denied prior to failing treatment with Lialda and Entocort. Started pantoprazole 40 mg for dyspepsia related to prednisone. Required switch from Entocort to prednisone due to ongoing symptoms. Started Humira 12/06/20. Feeling better since starting Humira.   Having one formed bowel movement daily. Rare blood. Perianal discharge is improving. Energy is better. Appete is better. Weight has increased. At it's worst she was having was 4 water bowel movements daily with significant urgency.    Some dry eyes but she wears contacts.   Some vaginal bleeding. Normally does not have periods on Loestrin.   No new complaints or concerns.    IBD HISTORY:   Year of disease onset: 2022  Brief IBD Disease Course:  - 2022 - onset of perianal symptoms and diarrhea. Imaging and CT confirmed Crohn's. Fecal calprotectin 673 10/02/20. Insurance denied  biologics. Started on Lialda and budeonside. No improvement. Required prednisone. Started Humira 12/06/20.    Endoscopy:  - Colonoscopy 09/19/20 - inflammation throughout the entire colon except for the distal sigmoid and rectum.  Biopsies of the right colon showed ulcer with moderate active colitis, transverse colon with focal active colitis, left colon and rectum without significant pathologic findings.  The TI was normal.  The pathology report was consistent with IBD. PATH = IBD  Imaging:  - Pelvic MRI w/wo contrast 09/03/2020: Small perianal fistula suggested arising from the inferior aspect of the sphincter complex the immediate perianal soft tissues, extending inferiorly and potentially into the gluteal cleft perhaps slightly more on the RIGHT than the LEFT. No drainable fluid. Signs of ileitis with ascites and interloop fluid of unclear etiology. Given constellation of findings would consider the possibility of inflammatory bowel disease. Ascites in the pelvis. - CT-E 09/12/20 - fairly marked wall thickening to the distal and terminal ileum suggestive of active Crohn's disease  Prior IBD medications (type, dose, duration, response): x 5-ASAs - Lialda x Oral corticosteroids - Prednisone, budesonide x Antibiotics - Cipro and Flagyl x Anti-TNF therapies - started Humira 12/06/20  IBD health maintenance: Influenza vaccine: Receives annually Pneumonia vaccine: Never Hepatitis B: Completed for vaccine TB testing: QuantiFERON 10/02/20 Chickenpox/Shingles history: Chicken pox in preschool Bone denistometry: None Derm appointment: Dermatologist, previously PRN Last small bowel imaging: 08/2020 Last colonoscopy: 08/2020 Not considering family planning for another 2-3 years   Extraintestinal manifestations:  -joint pains affecting: Bilateral knees, legs, and feet -eye: n -skin: n -oral ulcers : Previously, last 5/22 -blood clots: n -PSC: n -other: n     Past Medical History:  Diagnosis  Date  Anxiety    Chronic anal fissure    COVID 06/2019   sob, chest pain loss of taste and smell fever coughx 1 week all symptoms resolved   COVID-19 11/2020   Crohn's colitis ([place])    Fistula, anal    Wears glasses     Past Surgical History:  Procedure Laterality Date   COLONOSCOPY     lumps removed from both breasts Bilateral 2011   fibrous adenomas benign   RECTAL EXAM UNDER ANESTHESIA N/A 06/21/2020   Procedure: ANORECTAL EXAM UNDER ANESTHESIA. INJECTION OF ANAL BOTOX;  Surgeon: Melissa Roup, MD;  Location: [facility];  Service: General;  Laterality: N/A;   WISDOM TOOTH EXTRACTION      Current Outpatient Medications  Medication Sig Dispense Refill   citalopram (CELEXA) 20 MG tablet Take 20 mg by mouth daily.     ELDERBERRY PO Take by mouth. daily     LO LOESTRIN FE 1 MG-10 MCG / 10 MCG tablet Take 1 tablet by mouth daily.     mesalamine (LIALDA) 1.2 g EC tablet Take 4.8 g by mouth daily with breakfast.     OVER THE COUNTER MEDICATION Probiotic gummy- one daily     Adalimumab (HUMIRA PEN) 40 MG/0.4ML PNKT Inject 40 mg into the skin every 14 (fourteen) days. Begin day 29 2 each 6   No current facility-administered medications for this visit.    Allergies as of 03/19/2021 - Review Complete 03/19/2021  Allergen Reaction Noted   Crab (diagnostic)  06/19/2020    Family History  Problem Relation Age of Onset   Heart block Father    Colon cancer Paternal Grandmother    Stomach cancer Neg Hx    Esophageal cancer Neg Hx    Pancreatic cancer Neg Hx    Rectal cancer Neg Hx      Physical Exam: General:   Alert,  well-nourished, pleasant and cooperative in NAD Head:  Normocephalic and atraumatic. Eyes:  Sclera clear, no icterus.   Conjunctiva pink. Abdomen:  Soft,nontender, nondistended, normal bowel sounds, no rebound or guarding. No hepatosplenomegaly.   Rectal:   No chemical dermatitis. Persistent anterior fissure and posterior fissures with  overlying yellow exudate, small fistula orifice to the right posterior anal area. No exudate.  Neurologic:  Alert and  oriented x4;  grossly nonfocal Skin:  Intact without significant lesions or rashes. Psych:  Alert and cooperative. Normal mood and affect.     Melissa Nicholson L. Tarri Glenn, MD, MPH 03/24/2021, 7:52 PM

## 2021-03-24 ENCOUNTER — Encounter: Payer: Self-pay | Admitting: Gastroenterology

## 2021-05-21 ENCOUNTER — Other Ambulatory Visit: Payer: Self-pay

## 2021-05-21 ENCOUNTER — Encounter: Payer: Self-pay | Admitting: Gastroenterology

## 2021-05-21 ENCOUNTER — Ambulatory Visit: Payer: BC Managed Care – PPO | Admitting: Physician Assistant

## 2021-05-21 ENCOUNTER — Encounter: Payer: Self-pay | Admitting: Physician Assistant

## 2021-05-21 DIAGNOSIS — L659 Nonscarring hair loss, unspecified: Secondary | ICD-10-CM

## 2021-05-21 DIAGNOSIS — L700 Acne vulgaris: Secondary | ICD-10-CM | POA: Diagnosis not present

## 2021-05-21 MED ORDER — TAZAROTENE 0.1 % EX GEL: Freq: Every day | CUTANEOUS | 4 refills | Status: DC

## 2021-05-21 MED ORDER — AMZEEQ 4 % EX FOAM: 1.0000 "application " | Freq: Every morning | CUTANEOUS | 4 refills | Status: DC

## 2021-05-21 NOTE — Progress Notes (Signed)
° °  Follow-Up Visit   Subjective  Melissa Nicholson is a 30 y.o. female who presents for the following: Alopecia (Patient here to discuss hair loss x months. No family history of hair loss. New medication humira for a new diagnosis of crohn's disease.  TSH has not been performed and she is not having any symptoms. ) Her acne is also flaring. She is getting painful cysts mostly on her jaw line , cheeks and chin. She indicated that today was actually a good day for her acne.    The following portions of the chart were reviewed this encounter and updated as appropriate:  Tobacco   Allergies   Meds   Problems   Med Hx   Surg Hx   Fam Hx       Objective  Well appearing patient in no apparent distress; mood and affect are within normal limits.  A focused examination was performed including scalp and face. Relevant physical exam findings are noted in the Assessment and Plan.  Scalp Negative hair pull. No evidence of large part spacing.   Perioral Erythematous papules and pustules.    Assessment & Plan  Hair loss Scalp  Over the counter Biotin or hair and nail vitamin.  Acne vulgaris Perioral  tazarotene (TAZORAC) 0.1 % gel - Perioral Apply topically at bedtime.  Minocycline HCl Micronized (AMZEEQ) 4 % FOAM - Perioral Apply 1 application topically in the morning.    I, Dandre Sisler, PA-C, have reviewed all documentation's for this visit.  The documentation on 05/21/21 for the exam, diagnosis, procedures and orders are all accurate and complete.

## 2021-06-02 DIAGNOSIS — L709 Acne, unspecified: Secondary | ICD-10-CM

## 2021-06-02 HISTORY — DX: Acne, unspecified: L70.9

## 2021-06-05 ENCOUNTER — Other Ambulatory Visit (INDEPENDENT_AMBULATORY_CARE_PROVIDER_SITE_OTHER): Payer: BC Managed Care – PPO

## 2021-06-05 ENCOUNTER — Ambulatory Visit: Payer: BC Managed Care – PPO | Admitting: Gastroenterology

## 2021-06-05 ENCOUNTER — Encounter: Payer: Self-pay | Admitting: Gastroenterology

## 2021-06-05 VITALS — BP 100/60 | HR 60 | Ht 64.5 in | Wt 138.6 lb

## 2021-06-05 DIAGNOSIS — K50119 Crohn's disease of large intestine with unspecified complications: Secondary | ICD-10-CM

## 2021-06-05 LAB — CBC WITH DIFFERENTIAL/PLATELET
Basophils Absolute: 0 10*3/uL (ref 0.0–0.1)
Basophils Relative: 0.2 % (ref 0.0–3.0)
Eosinophils Absolute: 0.1 10*3/uL (ref 0.0–0.7)
Eosinophils Relative: 1.2 % (ref 0.0–5.0)
HCT: 38.7 % (ref 36.0–46.0)
Hemoglobin: 12.8 g/dL (ref 12.0–15.0)
Lymphocytes Relative: 48.1 % — ABNORMAL HIGH (ref 12.0–46.0)
Lymphs Abs: 3.2 10*3/uL (ref 0.7–4.0)
MCHC: 33 g/dL (ref 30.0–36.0)
MCV: 89.7 fl (ref 78.0–100.0)
Monocytes Absolute: 0.5 10*3/uL (ref 0.1–1.0)
Monocytes Relative: 7.7 % (ref 3.0–12.0)
Neutro Abs: 2.9 10*3/uL (ref 1.4–7.7)
Neutrophils Relative %: 42.8 % — ABNORMAL LOW (ref 43.0–77.0)
Platelets: 190 10*3/uL (ref 150.0–400.0)
RBC: 4.31 Mil/uL (ref 3.87–5.11)
RDW: 13.3 % (ref 11.5–15.5)
WBC: 6.7 10*3/uL (ref 4.0–10.5)

## 2021-06-05 NOTE — Progress Notes (Signed)
Referring Provider: Dineen Kid, MD Primary Care Physician:  Dineen Kid, MD  Chief complaint:  Crohn's disease   IMPRESSION:  Ileocolonic Crohn's disease with perianal fistula: Working towards clinical remission on Humira.  Remains on mesalamine 4.8 g daily.  Off prednisone.  Must consider addition of thiopurine.   Anal fissures s/p chemical sphincterotomy with injection of perianal Botox on 06/21/2020  PLAN: - CBC every 3 months (due today) - Fecal calprotectin to monitor response to therapy 2 weeks prior to next office visit - Continue Lialda 4.8 g daily and Humira 40 mg dosed every 2 weeks - Encouraged annual follow-up with GYN - Obtain vitamin D 25-OH level, B12 level - Annual follow-up with GYN for PAP smear - Annual follow-up with dermatology, sun exposure precautions - Calcium and vitamin D supplementations - Follow-up in 3 months, earlier if needed   Please see the "Patient Instructions" section for addition details about the plan.  HPI: Melissa Nicholson is a 31 y.o. female who returns in follow-up for a relatively new diagnosis of Crohn's diagnosed on colonoscopy 09/19/20.  Feeling better since starting Humira 12/06/20.    Having one formed bowel movement daily. Rare blood. Perianal discharge is improving. Energy is better. Appete is better. Weight has increased. No new complaints or concerns.   Fecal calprotectin was normal at 38, ESR 7, CRP <1.0 02/15/21.   IBD HISTORY:   Year of disease onset: 2022  Brief IBD Disease Course:  - 2022 - onset of perianal symptoms and diarrhea. Imaging and CT confirmed Crohn's. Fecal calprotectin 673 10/02/20. Insurance denied biologics. Started on Lialda and budeonside. No improvement. Required prednisone. Started Humira 12/06/20.    Endoscopy:  - Colonoscopy 09/19/20 - inflammation throughout the entire colon except for the distal sigmoid and rectum.  Biopsies of the right colon showed ulcer with moderate active colitis, transverse  colon with focal active colitis, left colon and rectum without significant pathologic findings.  The TI was normal.  The pathology report was consistent with IBD. PATH = IBD  Imaging:  - Pelvic MRI w/wo contrast 09/03/2020: Small perianal fistula suggested arising from the inferior aspect of the sphincter complex the immediate perianal soft tissues, extending inferiorly and potentially into the gluteal cleft perhaps slightly more on the RIGHT than the LEFT. No drainable fluid. Signs of ileitis with ascites and interloop fluid of unclear etiology. Given constellation of findings would consider the possibility of inflammatory bowel disease. Ascites in the pelvis. - CT-E 09/12/20 - fairly marked wall thickening to the distal and terminal ileum suggestive of active Crohn's disease  Prior IBD medications (type, dose, duration, response): x 5-ASAs - Lialda x Oral corticosteroids - Prednisone, budesonide x Antibiotics - Cipro and Flagyl x Anti-TNF therapies - started Humira 12/06/20  IBD health maintenance: Influenza vaccine: Receives annually Pneumonia vaccine: Never Hepatitis B: Completed for vaccine TB testing: QuantiFERON 10/02/20 Chickenpox/Shingles history: Chicken pox in preschool Bone denistometry: None Derm appointment: Dermatologist, previously PRN Last small bowel imaging: 08/2020 Last colonoscopy: 08/2020 Not considering family planning for another 2-3 years   Extraintestinal manifestations:  -joint pains affecting: Bilateral knees, legs, and feet -eye: n -skin: n -oral ulcers : Previously, last 5/22 -blood clots: n -PSC: n -other: n     Past Medical History:  Diagnosis Date   Anxiety    Atypical mole 06/13/2016   lower mid back mild   Chronic anal fissure    COVID 06/2019   sob, chest pain loss of taste and smell fever coughx  1 week all symptoms resolved   COVID-19 11/2020   Crohn's colitis ([place])    Fistula, anal    Wears glasses     Past Surgical History:  Procedure  Laterality Date   ANAL FISSURE REPAIR     with botox   COLONOSCOPY     lumps removed from both breasts Bilateral 2011   fibrous adenomas benign   RECTAL EXAM UNDER ANESTHESIA N/A 06/21/2020   Procedure: ANORECTAL EXAM UNDER ANESTHESIA. INJECTION OF ANAL BOTOX;  Surgeon: Ileana Roup, MD;  Location: [facility];  Service: General;  Laterality: N/A;   WISDOM TOOTH EXTRACTION      Current Outpatient Medications  Medication Sig Dispense Refill   Adalimumab (HUMIRA PEN) 40 MG/0.4ML PNKT Inject 40 mg into the skin every 14 (fourteen) days. Begin day 29 2 each 6   citalopram (CELEXA) 20 MG tablet Take 20 mg by mouth daily.     ELDERBERRY PO Take by mouth. daily     LO LOESTRIN FE 1 MG-10 MCG / 10 MCG tablet Take 1 tablet by mouth daily.     mesalamine (LIALDA) 1.2 g EC tablet Take 4.8 g by mouth daily with breakfast.     OVER THE COUNTER MEDICATION Probiotic gummy- one daily     tazarotene (TAZORAC) 0.1 % gel Apply topically at bedtime. 30 g 4   No current facility-administered medications for this visit.    Allergies as of 06/05/2021 - Review Complete 06/05/2021  Allergen Reaction Noted   Crab (diagnostic)  06/19/2020    Family History  Problem Relation Age of Onset   Heart block Father    Colon cancer Paternal Grandmother    Stomach cancer Neg Hx    Esophageal cancer Neg Hx    Pancreatic cancer Neg Hx    Rectal cancer Neg Hx      Physical Exam: General:   Alert,  well-nourished, pleasant and cooperative in NAD Head:  Normocephalic and atraumatic. Eyes:  Sclera clear, no icterus.   Conjunctiva pink. Abdomen:  Soft, nontender, nondistended, normal bowel sounds, no rebound or guarding. No hepatosplenomegaly.   Rectal:   Not performed today.  Neurologic:  Alert and  oriented x4;  grossly nonfocal Skin:  Intact without significant lesions or rashes. Psych:  Alert and cooperative. Normal mood and affect.     Melissa Nicholson L. Tarri Glenn, MD, MPH 06/05/2021, 3:49  PM

## 2021-06-05 NOTE — Patient Instructions (Addendum)
Your provider has requested that you go to the basement level for lab work before leaving today. Press "B" on the elevator. The lab is located at the first door on the left as you exit the elevator.   CBC every 3 months (due today) - Fecal calprotectin to monitor response to therapy 2 weeks prior to next office visit - Continue Lialda 4.8 g daily and Humira 40 mg dosed every 2 weeks - Calcium and vitamin D supplementations - Follow-up in 3 months, earlier if needed

## 2021-06-06 ENCOUNTER — Other Ambulatory Visit: Payer: BC Managed Care – PPO

## 2021-06-06 ENCOUNTER — Encounter: Payer: Self-pay | Admitting: Gastroenterology

## 2021-06-06 DIAGNOSIS — K50119 Crohn's disease of large intestine with unspecified complications: Secondary | ICD-10-CM | POA: Diagnosis not present

## 2021-06-12 LAB — CALPROTECTIN, FECAL: Calprotectin, Fecal: 19 ug/g (ref 0–120)

## 2021-06-17 ENCOUNTER — Telehealth: Payer: Self-pay | Admitting: Physician Assistant

## 2021-06-17 MED ORDER — AMZEEQ 4 % EX FOAM: 1.0000 "application " | Freq: Every day | CUTANEOUS | 2 refills | Status: DC

## 2021-06-17 NOTE — Telephone Encounter (Signed)
Phone call to patient to let her know we can send in medication amzeeq to [facility]. Patient is going to come by and pick up sample to try before she gets prescription.

## 2021-06-17 NOTE — Telephone Encounter (Signed)
Patient calling to let us know that Amzeeq is over $100 at local pharmacy. She states that she was informed to call and let us know so we can try to get it cheaper at mail order pharmacy.

## 2021-06-20 ENCOUNTER — Other Ambulatory Visit: Payer: Self-pay | Admitting: Nurse Practitioner

## 2021-06-30 ENCOUNTER — Other Ambulatory Visit: Payer: Self-pay | Admitting: Pharmacy Technician

## 2021-07-02 ENCOUNTER — Encounter: Payer: Self-pay | Admitting: Gastroenterology

## 2021-07-02 NOTE — Telephone Encounter (Signed)
Called and spoke with patient. She has been scheduled to see Carl Best, NP on Friday, 07/05/21 at 9 am. Pt had no other concerns at the end of the call.

## 2021-07-04 NOTE — Progress Notes (Signed)
07/04/2021 Melissa Nicholson [id number] 10-11-1990   Chief Complaint: Globus sensation  History of Present Illness:  Melissa Nicholson is a 31 year old female with a past medical history of anxiety, Covid 19 infection and an fissures s/p chemical sphincterotomy with injection of perianal Botox on 06/21/2020 subsequently diagnosed with  Ileocolonic Crohn's disease with perianal fistula per image studies and colonoscopy 08/2020. She was started on Humira 12/06/2020. No longer on Prednisone. She is followed by Dr. Tarri Glenn.   She presents to our office today for further evaluation regarding a globus sensation and dry throat which started one week ago. She describes feeling like there is something in the bottom of her throat which is different than the lump sensation she felt previously when she was on Prednisone which resolved after she took Protonix for a few months.  She denies having any difficulty swallowing.  No odynophagia.  She feels dryness sensation to the right and left sides deep in her throat.  The lump sensation and throat soreness comes and goes.  No heartburn or upper abdominal pain.  No lower abdominal pain.  She has frequent throat clearing, possible postnasal drainage.  Regarding her Crohn's disease, she feels quite well.  She is passing 1-2 formed bowel movements daily.  No rectal bleeding.  She feels her fistula is nearly healed and drains a scant amount of brown liquid which she sees on the toilet tissue once daily.  She remains on Humira 40 mg subcu every 2 weeks and mesalamine 1.2 g 4 tabs p.o. daily.  No recent antibiotic use..   CBC Latest Ref Rng & Units 06/05/2021 02/14/2021 08/27/2020  WBC 4.0 - 10.5 K/uL 6.7 4.4 8.0  Hemoglobin 12.0 - 15.0 g/dL 12.8 12.8 13.0  Hematocrit 36.0 - 46.0 % 38.7 38.9 39.3  Platelets 150.0 - 400.0 K/uL 190.0 181.0 294.0    CMP Latest Ref Rng & Units 12/04/2020 11/28/2020 08/27/2020  Glucose 70 - 99 mg/dL 126(H) 102(H) 104(H)  BUN 6 - 23 mg/dL 11 14 9    Creatinine 0.40 - 1.20 mg/dL 0.90 0.93 0.85  Sodium 135 - 145 mEq/L 138 137 137  Potassium 3.5 - 5.1 mEq/L 4.0 3.4(L) 4.0  Chloride 96 - 112 mEq/L 100 100 103  CO2 19 - 32 mEq/L 30 29 24   Calcium 8.4 - 10.5 mg/dL 8.6 8.8 8.9  Total Protein 6.0 - 8.3 g/dL - - 7.6  Total Bilirubin 0.2 - 1.2 mg/dL - - 0.3  Alkaline Phos 39 - 117 U/L - - 61  AST 0 - 37 U/L - - 11  ALT 0 - 35 U/L - - 22  Adalimumab drug level 11. Adalimumab antibody level < 25 on 02/14/2021. TPMT level 19.1 and Quantiferon gold negative on 10/02/2020 Fecal calprotectin level < 19 on 06/06/2021  GI procedures:  - Colonoscopy 09/19/2020 - inflammation throughout the entire colon except for the distal sigmoid and rectum.  Biopsies of the right colon showed ulcer with moderate active colitis, transverse colon with focal active colitis, left colon and rectum without significant pathologic findings.  The TI was normal.  The pathology report was consistent with IBD. PATH = IBD  Imaging:  - Pelvic MRI w/wo contrast 09/03/2020: Small perianal fistula suggested arising from the inferior aspect of the sphincter complex the immediate perianal soft tissues, extending inferiorly and potentially into the gluteal cleft perhaps slightly more on the RIGHT than the LEFT. No drainable fluid. Signs of ileitis with ascites and interloop fluid of unclear  etiology. Given constellation of findings would consider the possibility of inflammatory bowel disease. Ascites in the pelvis. - CT-E 09/12/20 - fairly marked wall thickening to the distal and terminal ileum suggestive of active Crohn's disease  Current Outpatient Medications on File Prior to Visit  Medication Sig Dispense Refill   Adalimumab (HUMIRA PEN) 40 MG/0.4ML PNKT Inject 40 mg into the skin every 14 (fourteen) days. Begin day 29 2 each 6   citalopram (CELEXA) 20 MG tablet Take 20 mg by mouth daily.     ELDERBERRY PO Take by mouth. daily     LO LOESTRIN FE 1 MG-10 MCG / 10 MCG tablet Take 1  tablet by mouth daily.     mesalamine (LIALDA) 1.2 g EC tablet TAKE 4 TABLETS BY MOUTH EVERY DAY 360 tablet 1   Minocycline HCl Micronized (AMZEEQ) 4 % FOAM Apply 1 application topically daily. 30 g 2   OVER THE COUNTER MEDICATION Probiotic gummy- one daily     tazarotene (TAZORAC) 0.1 % gel Apply topically at bedtime. 30 g 4   No current facility-administered medications on file prior to visit.   Allergies  Allergen Reactions   Crab (Diagnostic)     Swelling of eyes, she thinks it was a seafood mix    Current Medications, Allergies, Past Medical History, Past Surgical History, Family History and Social History were reviewed in [employer] record.  Review of Systems:   Constitutional: Negative for fever, sweats, chills or weight loss.  Respiratory: Negative for shortness of breath.   Cardiovascular: Negative for chest pain, palpitations and leg swelling.  Gastrointestinal: See HPI.  Musculoskeletal: Negative for back pain or muscle aches.  Neurological: Negative for dizziness, headaches or paresthesias.   Physical Exam: BP 102/60    Pulse 73    Ht 5\' 5"  (1.651 m)    Wt 138 lb (62.6 kg)    SpO2 98%    BMI 22.96 kg/m   General: 31 year old female in no acute distress. Head: Normocephalic and atraumatic. Eyes: No scleral icterus. Conjunctiva pink . Ears: Normal auditory acuity. Mouth: Dentition intact. No ulcers or lesions.  No thrush.  Posterior pharynx without erythema/exudate or obvious postnasal drainage. Neck: No lymphadenopathy or thyromegaly. Lungs: Clear throughout to auscultation. Heart: Regular rate and rhythm, no murmur. Abdomen: Soft, nontender and nondistended. No masses or hepatomegaly. Normal bowel sounds x 4 quadrants.  Rectal: Deferred. Musculoskeletal: Symmetrical with no gross deformities. Extremities: No edema. Neurological: Alert oriented x 4. No focal deficits.  Psychological: Alert and cooperative. Normal mood and  affect  Assessment and Recommendations:  54)  31 year old female with Ileocolonic Crohn's disease with perianal fistula initial diagnosed per MRI/CT image studies and colonoscopy 09/19/2020.  On Humira 40 mg subcu every 2 weeks and mesalamine 4.8 g p.o. daily.  No abdominal pain.  She is passing normal formed brown bowel movements daily.  Scant amount of brown drainage from her fistula. -Continue Humira, mesalamine as previously prescribed -Patient has a dermatology appointment as scheduled 07/30/2021, recommended due to Humira use  2) Globus sensation with right and left throat irritation/dryness for 1 week -Pantoprazole 40 mg 1 p.o. daily for now -ENT evaluation, recommend laryngoscopy  -Consider barium swallow with tablet vs EGD if ENT evaluation unrevealing and if symptoms persist

## 2021-07-05 ENCOUNTER — Ambulatory Visit: Payer: BC Managed Care – PPO | Admitting: Nurse Practitioner

## 2021-07-05 ENCOUNTER — Encounter: Payer: Self-pay | Admitting: Nurse Practitioner

## 2021-07-05 VITALS — BP 102/60 | HR 73 | Ht 65.0 in | Wt 138.0 lb

## 2021-07-05 DIAGNOSIS — R0989 Other specified symptoms and signs involving the circulatory and respiratory systems: Secondary | ICD-10-CM | POA: Diagnosis not present

## 2021-07-05 NOTE — Patient Instructions (Addendum)
We place placed a referral to ENT.  You should hear from their office in the next couple weeks to schedule an appointment.  Take Pantoprazole that you have at home, 40 MG once a day. Let us know when you need a refill.    BMI:  If you are age 33 or older, your body mass index should be between 23-30. Your Body mass index is 22.96 kg/m. If this is out of the aforementioned range listed, please consider follow up with your Primary Care Provider.  If you are age 50 or younger, your body mass index should be between 19-25. Your Body mass index is 22.96 kg/m. If this is out of the aformentioned range listed, please consider follow up with your Primary Care Provider.   MY CHART:  The [facility] providers would like to encourage you to use [facility] to communicate with providers for non-urgent requests or questions.  Due to long hold times on the telephone, sending your provider a message by [facility] may be a faster and more efficient way to get a response.  Please allow 48 business hours for a response.  Please remember that this is for non-urgent requests.   Thank you for trusting me with your gastrointestinal care!    Noralyn Pick, CRNP

## 2021-07-07 NOTE — Progress Notes (Signed)
Reviewed and agree with management plans. ? ?Sully Dyment L. Jachin Coury, MD, MPH  ?

## 2021-07-10 ENCOUNTER — Telehealth: Payer: Self-pay

## 2021-07-10 NOTE — Telephone Encounter (Signed)
PRIOR AUTHORIZATION  PA initiation date: 07/10/21  Medication: [place]: [employer] completed electronically through [employer] My Meds: Yes  Will await insurance response re: approval/denial.  Arther Dames (Key: [id number])  Your information has been submitted to [place]. [employer] [place] will review the request and notify you of the determination decision directly, typically within 72 hours of receiving all information.  You will also receive your request decision electronically. To check for an update later, open this request again from your dashboard.  If [employer] [place] has not responded within the specified timeframe or if you have any questions about your PA submission, contact [place] directly at [phone].

## 2021-07-11 NOTE — Telephone Encounter (Signed)
APPROVAL  Medication: [place]: BCBS PA response: APPROVED Approval dates: Effective from 07/10/2021 through 07/09/2022. Misc. Notes: Arther Dames (Key: [id number])  This request has received a Favorable outcome from [place].  Please keep in mind this is not a guarantee of payment. Eligibility and Benefit determinations will be made at the time of service.  Please note any additional information provided by [facility] at the bottom of the screen.

## 2021-07-18 ENCOUNTER — Encounter: Payer: Self-pay | Admitting: Gastroenterology

## 2021-07-18 DIAGNOSIS — R0989 Other specified symptoms and signs involving the circulatory and respiratory systems: Secondary | ICD-10-CM

## 2021-07-18 DIAGNOSIS — K50119 Crohn's disease of large intestine with unspecified complications: Secondary | ICD-10-CM

## 2021-07-30 ENCOUNTER — Encounter: Payer: Self-pay | Admitting: Gastroenterology

## 2021-07-30 ENCOUNTER — Other Ambulatory Visit: Payer: Self-pay

## 2021-07-30 ENCOUNTER — Ambulatory Visit: Payer: BC Managed Care – PPO | Admitting: Physician Assistant

## 2021-07-30 ENCOUNTER — Encounter: Payer: Self-pay | Admitting: Physician Assistant

## 2021-07-30 VITALS — Wt 135.0 lb

## 2021-07-30 DIAGNOSIS — L700 Acne vulgaris: Secondary | ICD-10-CM

## 2021-07-30 DIAGNOSIS — Z1283 Encounter for screening for malignant neoplasm of skin: Secondary | ICD-10-CM | POA: Diagnosis not present

## 2021-07-30 DIAGNOSIS — Z5181 Encounter for therapeutic drug level monitoring: Secondary | ICD-10-CM | POA: Diagnosis not present

## 2021-07-30 LAB — POCT URINE PREGNANCY: Preg Test, Ur: NEGATIVE

## 2021-07-31 ENCOUNTER — Other Ambulatory Visit: Payer: Self-pay

## 2021-07-31 DIAGNOSIS — K50119 Crohn's disease of large intestine with unspecified complications: Secondary | ICD-10-CM

## 2021-08-19 ENCOUNTER — Encounter: Payer: Self-pay | Admitting: Physician Assistant

## 2021-08-29 ENCOUNTER — Other Ambulatory Visit (INDEPENDENT_AMBULATORY_CARE_PROVIDER_SITE_OTHER): Payer: BC Managed Care – PPO

## 2021-08-29 DIAGNOSIS — K50119 Crohn's disease of large intestine with unspecified complications: Secondary | ICD-10-CM

## 2021-08-29 DIAGNOSIS — R1314 Dysphagia, pharyngoesophageal phase: Secondary | ICD-10-CM | POA: Diagnosis not present

## 2021-08-29 DIAGNOSIS — K219 Gastro-esophageal reflux disease without esophagitis: Secondary | ICD-10-CM | POA: Diagnosis not present

## 2021-08-29 LAB — CBC
HCT: 39.8 % (ref 36.0–46.0)
Hemoglobin: 13.6 g/dL (ref 12.0–15.0)
MCHC: 34.1 g/dL (ref 30.0–36.0)
MCV: 90.3 fl (ref 78.0–100.0)
Platelets: 196 10*3/uL (ref 150.0–400.0)
RBC: 4.41 Mil/uL (ref 3.87–5.11)
RDW: 12.3 % (ref 11.5–15.5)
WBC: 5.6 10*3/uL (ref 4.0–10.5)

## 2021-09-02 ENCOUNTER — Ambulatory Visit: Payer: BC Managed Care – PPO | Admitting: Dermatology

## 2021-09-02 ENCOUNTER — Encounter: Payer: Self-pay | Admitting: Dermatology

## 2021-09-02 DIAGNOSIS — Z5181 Encounter for therapeutic drug level monitoring: Secondary | ICD-10-CM | POA: Diagnosis not present

## 2021-09-02 DIAGNOSIS — L700 Acne vulgaris: Secondary | ICD-10-CM | POA: Diagnosis not present

## 2021-09-02 MED ORDER — ISOTRETINOIN 40 MG PO CAPS: 40.0000 mg | ORAL_CAPSULE | Freq: Every day | ORAL | 0 refills | Status: AC

## 2021-09-03 LAB — CBC WITH DIFFERENTIAL/PLATELET
Absolute Monocytes: 389 cells/uL (ref 200–950)
Basophils Absolute: 20 cells/uL (ref 0–200)
Basophils Relative: 0.3 %
Eosinophils Absolute: 79 cells/uL (ref 15–500)
Eosinophils Relative: 1.2 %
HCT: 39.4 % (ref 35.0–45.0)
Hemoglobin: 13.1 g/dL (ref 11.7–15.5)
Lymphs Abs: 2614 cells/uL (ref 850–3900)
MCH: 30.3 pg (ref 27.0–33.0)
MCHC: 33.2 g/dL (ref 32.0–36.0)
MCV: 91.2 fL (ref 80.0–100.0)
MPV: 11.5 fL (ref 7.5–12.5)
Monocytes Relative: 5.9 %
Neutro Abs: 3498 cells/uL (ref 1500–7800)
Neutrophils Relative %: 53 %
Platelets: 239 10*3/uL (ref 140–400)
RBC: 4.32 10*6/uL (ref 3.80–5.10)
RDW: 11.7 % (ref 11.0–15.0)
Total Lymphocyte: 39.6 %
WBC: 6.6 10*3/uL (ref 3.8–10.8)

## 2021-09-03 LAB — COMPREHENSIVE METABOLIC PANEL
AG Ratio: 1.4 (calc) (ref 1.0–2.5)
ALT: 14 U/L (ref 6–29)
AST: 14 U/L (ref 10–30)
Albumin: 4.1 g/dL (ref 3.6–5.1)
Alkaline phosphatase (APISO): 39 U/L (ref 31–125)
BUN: 9 mg/dL (ref 7–25)
CO2: 24 mmol/L (ref 20–32)
Calcium: 9.1 mg/dL (ref 8.6–10.2)
Chloride: 106 mmol/L (ref 98–110)
Creat: 0.81 mg/dL (ref 0.50–0.97)
Globulin: 3 g/dL (calc) (ref 1.9–3.7)
Glucose, Bld: 97 mg/dL (ref 65–99)
Potassium: 4.6 mmol/L (ref 3.5–5.3)
Sodium: 139 mmol/L (ref 135–146)
Total Bilirubin: 0.2 mg/dL (ref 0.2–1.2)
Total Protein: 7.1 g/dL (ref 6.1–8.1)

## 2021-09-03 LAB — ADVANCED WRITTEN NOTIFICATION (AWN) TEST REFUSAL: AWN TEST REFUSED: 7600

## 2021-09-03 LAB — HCG, SERUM, QUALITATIVE: Preg, Serum: NEGATIVE

## 2021-09-04 ENCOUNTER — Encounter: Payer: Self-pay | Admitting: Gastroenterology

## 2021-09-04 ENCOUNTER — Ambulatory Visit: Payer: BC Managed Care – PPO | Admitting: Gastroenterology

## 2021-09-04 VITALS — BP 118/62 | HR 70 | Ht 65.0 in | Wt 141.0 lb

## 2021-09-04 DIAGNOSIS — R0989 Other specified symptoms and signs involving the circulatory and respiratory systems: Secondary | ICD-10-CM | POA: Diagnosis not present

## 2021-09-04 DIAGNOSIS — K603 Anal fistula: Secondary | ICD-10-CM | POA: Diagnosis not present

## 2021-09-04 DIAGNOSIS — K50119 Crohn's disease of large intestine with unspecified complications: Secondary | ICD-10-CM | POA: Diagnosis not present

## 2021-09-04 DIAGNOSIS — K602 Anal fissure, unspecified: Secondary | ICD-10-CM

## 2021-09-04 NOTE — Progress Notes (Signed)
See photo

## 2021-09-04 NOTE — Progress Notes (Signed)
? ?Referring Provider: Dineen Kid, MD ?Primary Care Physician:  Dineen Kid, MD ? ?Chief complaint:  Crohn's disease ? ? ?IMPRESSION:  ?Ileocolonic Crohn's disease with perianal fistula: Started Humira 12/06/2020.  Remains on mesalamine 4.8 g daily.  Off prednisone.  Must consider addition of thiopurine.  ? 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  ? ?Anal fissures s/p chemical sphincterotomy with injection of perianal Botox on 06/21/2020 ? ?PLAN: ?Continue pantoprazole ?CBC every 3 months ?Fecal calprotectin to monitor response to therapy ?Continue Lialda 4.8 g daily and Humira 40 mg dosed every 2 weeks ?Calcium and vitamin D supplementations ?Annual dermatology head-to-toe exam scheduled in December ?Follow-up in 3 months, earlier if needed ? ? ?Please see the "Patient Instructions" section for addition details about the plan. ? ?HPI: Melissa Nicholson is a 31 y.o. female who returns in follow-up of Crohn's diagnosed on colonoscopy 09/19/20.  Recommended biologics for fistulizing disease. Insurance denied prior to failing treatment with Lialda and Entocort. Started pantoprazole 40 mg for dyspepsia related to prednisone. Required switch from Entocort to prednisone due to ongoing symptoms. Started Humira 12/06/20. Feeling better since starting Humira.  ? ?Recent symptoms of globus were evaluated by ENT in [facility] who diagnosed her with LPR. Symptoms improving on pantoprazole. ? ?Continues to feel better on Humira. Having one formed bowel movement daily. Rare blood. Perianal discharge continues to improve but has not resolved. Energy is better. Appete is better. Weight has increased.  ? ? ?IBD HISTORY:  ? ?Year of disease onset: 2022 ? ?Brief IBD Disease Course:  ?- 2022 - onset of perianal symptoms and diarrhea. Imaging and CT confirmed Crohn's. Fecal calprotectin 673 10/02/20. Insurance denied biologics. Started on Lialda and budeonside. No improvement. Required prednisone. Started Humira 12/06/20.  ? ? ?Endoscopy:  ?-  Colonoscopy 09/19/20 - inflammation throughout the entire colon except for the distal sigmoid and rectum.  Biopsies of the right colon showed ulcer with moderate active colitis, transverse colon with focal active colitis, left colon and rectum without significant pathologic findings.  The TI was normal.  The pathology report was consistent with IBD. ?PATH = IBD ? ?Imaging:  ?- Pelvic MRI w/wo contrast 09/03/2020: Small perianal fistula suggested arising from the inferior aspect ?of the sphincter complex the immediate perianal soft tissues, extending inferiorly and potentially into the gluteal cleft perhaps slightly more on the RIGHT than the LEFT. No drainable fluid. Signs of ileitis with ascites and interloop fluid of unclear etiology. Given constellation of findings would consider the possibility of inflammatory bowel disease. Ascites in the pelvis. ?- CT-E 09/12/20 - fairly marked wall thickening to the distal and terminal ileum suggestive of active Crohn's disease ? ?Prior IBD medications (type, dose, duration, response): ?x 5-ASAs - Lialda ?x Oral corticosteroids - Prednisone, budesonide ?x Antibiotics - Cipro and Flagyl ?x Anti-TNF therapies - started Humira 12/06/20 ? ?IBD health maintenance: ?Influenza vaccine: Receives annually ?Pneumonia vaccine: Never ?Hepatitis B: Completed for vaccine ?TB testing: QuantiFERON 10/02/20 ?Chickenpox/Shingles history: Chicken pox in preschool ?Bone denistometry: None ?Derm appointment: Dermatologist, previously PRN ?Last small bowel imaging: 08/2020 ?Last colonoscopy: 08/2020 ?Not considering family planning for another 2-3 years ? ? ?Extraintestinal manifestations:  ?-joint pains affecting: Bilateral knees, legs, and feet ?-eye: ? Dry eyes ?-skin: n ?-oral ulcers : Previously, last 5/22 ?-blood clots: n ?-PSC: n ?-other: n  ? ? ? ?Past Medical History:  ?Diagnosis Date  ? Anxiety   ? Atypical mole 06/13/2016  ? lower mid back mild  ? Chronic anal fissure   ?  COVID 06/2019  ? sob,  chest pain loss of taste and smell fever coughx 1 week all symptoms resolved  ? COVID-19 11/2020  ? Crohn's colitis ([place])   ? Fistula, anal   ? Wears glasses   ? ? ?Past Surgical History:  ?Procedure Laterality Date  ? ANAL FISSURE REPAIR    ? with botox  ? COLONOSCOPY    ? lumps removed from both breasts Bilateral 2011  ? fibrous adenomas benign  ? RECTAL EXAM UNDER ANESTHESIA N/A 06/21/2020  ? Procedure: ANORECTAL EXAM UNDER ANESTHESIA. INJECTION OF ANAL BOTOX;  Surgeon: Ileana Roup, MD;  Location: [facility];  Service: General;  Laterality: N/A;  ? WISDOM TOOTH EXTRACTION    ? ? ?Current Outpatient Medications  ?Medication Sig Dispense Refill  ? Adalimumab (HUMIRA PEN) 40 MG/0.4ML PNKT Inject 40 mg into the skin every 14 (fourteen) days. Begin day 29 2 each 6  ? citalopram (CELEXA) 20 MG tablet Take 20 mg by mouth daily.    ? ELDERBERRY PO Take by mouth. daily    ? ISOtretinoin (ACCUTANE) 40 MG capsule Take 1 capsule (40 mg total) by mouth daily. 30 capsule 0  ? LO LOESTRIN FE 1 MG-10 MCG / 10 MCG tablet Take 1 tablet by mouth daily.    ? mesalamine (LIALDA) 1.2 g EC tablet TAKE 4 TABLETS BY MOUTH EVERY DAY 360 tablet 1  ? OVER THE COUNTER MEDICATION Probiotic gummy- one daily    ? ?No current facility-administered medications for this visit.  ? ? ?Allergies as of 09/04/2021 - Review Complete 09/04/2021  ?Allergen Reaction Noted  ? Crab (diagnostic)  06/19/2020  ? ? ?Family History  ?Problem Relation Age of Onset  ? Heart block Father   ? Colon cancer Paternal Grandmother   ? Stomach cancer Neg Hx   ? Esophageal cancer Neg Hx   ? Pancreatic cancer Neg Hx   ? Rectal cancer Neg Hx   ? ? ? ?Physical Exam: ?General:   Alert,  well-nourished, pleasant and cooperative in NAD ?Head:  Normocephalic and atraumatic. ?Eyes:  Sclera clear, no icterus.   Conjunctiva pink. ?Abdomen:  Soft,nontender, nondistended, normal bowel sounds, no rebound or guarding. No hepatosplenomegaly.   ?Rectal:   No  chemical dermatitis. Persistent anterior fissure and posterior fissures with overlying yellow exudate, small fistula orifice to the right posterior anal area. No exudate.  ?Neurologic:  Alert and  oriented x4;  grossly nonfocal ?Skin:  Intact without significant lesions or rashes. ?Psych:  Alert and cooperative. Normal mood and affect. ? ? ? ? ?Janira Mandell L. Tarri Glenn, MD, MPH ?09/06/2021, 3:05 PM ? ? ? ?  ?

## 2021-09-04 NOTE — Patient Instructions (Signed)
If you are age 33 or older, your body mass index should be between 23-30. Your Body mass index is 23.46 kg/m?Marland Kitchen If this is out of the aforementioned range listed, please consider follow up with your Primary Care Provider. ? ?If you are age 89 or younger, your body mass index should be between 19-25. Your Body mass index is 23.46 kg/m?Marland Kitchen If this is out of the aformentioned range listed, please consider follow up with your Primary Care Provider.  ? ?________________________________________________________ ? ?The [facility] providers would like to encourage you to use [facility] to communicate with providers for non-urgent requests or questions.  Due to long hold times on the telephone, sending your provider a message by [facility] may be a faster and more efficient way to get a response.  Please allow 48 business hours for a response.  Please remember that this is for non-urgent requests.  ?_______________________________________________________ ? ? ?Follow up in 3 months. ? ?It was a pleasure to see you today! ? ?Thank you for trusting me with your gastrointestinal care!   ? ? ?

## 2021-09-08 ENCOUNTER — Other Ambulatory Visit: Payer: Self-pay | Admitting: Nurse Practitioner

## 2021-09-09 NOTE — Telephone Encounter (Signed)
Per 09/04/21 OV with Dr. Tarri Glenn: ? ?Continue Lialda 4.8 g daily  ? ?RX updated to reflect correct dose and refilled. ?

## 2021-09-11 ENCOUNTER — Telehealth: Payer: Self-pay | Admitting: Physician Assistant

## 2021-09-11 NOTE — Telephone Encounter (Signed)
Started isotretinoin on 09/03/2021 and is having redness across eyebrows and cheeks also itchy all over. She wants to know if this is normal. ?

## 2021-09-11 NOTE — Telephone Encounter (Signed)
I phoned patient to inquire about her pruritis and swelling on her forehead. She stated that it had gotten better. She will take a anti-histamine and use hydrocortisone on her face. She will call if it is persistent or gets worse. She will also let us know if she received myorisan for her medication, which has a much higher allergic reaction. We will see her any time.  ?

## 2021-09-12 ENCOUNTER — Ambulatory Visit (INDEPENDENT_AMBULATORY_CARE_PROVIDER_SITE_OTHER): Payer: BC Managed Care – PPO | Admitting: Physician Assistant

## 2021-09-12 ENCOUNTER — Encounter: Payer: Self-pay | Admitting: Physician Assistant

## 2021-09-12 DIAGNOSIS — R21 Rash and other nonspecific skin eruption: Secondary | ICD-10-CM

## 2021-09-12 NOTE — Progress Notes (Signed)
? ?  Follow-Up Visit ?  ?Subjective  ?Melissa Nicholson is a 31 y.o. female who presents for the following: Skin Problem (Rash on chest and eyebrows x yesterday- been taking claravis x 10 days). ? ? ?The following portions of the chart were reviewed this encounter and updated as appropriate:  Tobacco  Allergies  Meds  Problems  Med Hx  Surg Hx  Fam Hx   ?  ? ?Objective  ?Well appearing patient in no apparent distress; mood and affect are within normal limits. ? ?A focused examination was performed including face. Relevant physical exam findings are noted in the Assessment and Plan. ? ?glabella and cheeks ?Xerosis and edema. Acne mostly clear.  ? ? ?Assessment & Plan  ?Rash and other nonspecific skin eruption ?glabella and cheeks ? ?Decrease medication to every other day.  ? ? ? ?I, Zenon Leaf, PA-C, have reviewed all documentation's for this visit.  The documentation on 09/12/21 for the exam, diagnosis, procedures and orders are all accurate and complete. ?

## 2021-09-21 ENCOUNTER — Encounter: Payer: Self-pay | Admitting: Dermatology

## 2021-09-21 NOTE — Progress Notes (Signed)
? ?  Follow-Up Visit ?  ?Subjective  ?Melissa Nicholson is a 31 y.o. female who presents for the following: Acne (31 day isotretinoin ). ? ?Cystic acne ?Location:  ?Duration:  ?Quality:  ?Associated Signs/Symptoms: ?Modifying Factors:  ?Severity:  ?Timing: ?Context:  ? ?Objective  ?Well appearing patient in no apparent distress; mood and affect are within normal limits. ?Mid Forehead ?Happy with the deep inflammatory papules, cysts. ? ? ? ?A focused examination was performed including head and neck. Relevant physical exam findings are noted in the Assessment and Plan. ? ? ?Assessment & Plan  ? ? ?Acne vulgaris ?Mid Forehead ? ?I Again reviewed in detail all the side effects of isotretinoin along with the inflexibility of the [employer] pledge program.  She weigh 64 kg which would make her [place] target dose 7680 mg.  We will initiate therapy if labs are okay with 40 mg daily with her largest meal.  She knows she can reach me with any issues whatsoever. ? ?CBC with Differential/Platelet - Mid Forehead ? ?Comprehensive metabolic panel - Mid Forehead ? ?hCG, serum, qualitative - Mid Forehead ? ?Lipid panel - Mid Forehead ? ?Related Medications ?ISOtretinoin (ACCUTANE) 40 MG capsule ?Take 1 capsule (40 mg total) by mouth daily. ? ?Encounter for therapeutic drug monitoring ? ?Related Procedures ?CBC with Differential/Platelet ?Comprehensive metabolic panel ?hCG, serum, qualitative ?Lipid panel ? ?Related Medications ?ISOtretinoin (ACCUTANE) 40 MG capsule ?Take 1 capsule (40 mg total) by mouth daily. ? ? ?Other Procedures Placed This Encounter ?Advanced Written Notification [facility]) Test Refusal ? ? ? ? ?I, Lavonna Monarch, MD, have reviewed all documentation for this visit.  The documentation on 09/21/21 for the exam, diagnosis, procedures, and orders are all accurate and complete. ?

## 2021-10-01 ENCOUNTER — Encounter: Payer: Self-pay | Admitting: Gastroenterology

## 2021-10-03 ENCOUNTER — Ambulatory Visit: Payer: BC Managed Care – PPO | Admitting: Physician Assistant

## 2021-10-03 ENCOUNTER — Encounter: Payer: Self-pay | Admitting: Physician Assistant

## 2021-10-03 DIAGNOSIS — L700 Acne vulgaris: Secondary | ICD-10-CM

## 2021-10-03 DIAGNOSIS — Z5181 Encounter for therapeutic drug level monitoring: Secondary | ICD-10-CM | POA: Diagnosis not present

## 2021-10-03 MED ORDER — ISOTRETINOIN 20 MG PO CAPS: 20.0000 mg | ORAL_CAPSULE | Freq: Every day | ORAL | 0 refills | Status: DC

## 2021-10-03 MED ORDER — ISOTRETINOIN 40 MG PO CAPS: 40.0000 mg | ORAL_CAPSULE | Freq: Every day | ORAL | 0 refills | Status: DC

## 2021-10-04 LAB — PREGNANCY, URINE: Preg Test, Ur: NEGATIVE

## 2021-10-09 ENCOUNTER — Encounter: Payer: Self-pay | Admitting: Physician Assistant

## 2021-10-09 NOTE — Progress Notes (Signed)
? ?  Follow-Up Visit ?  ?Subjective  ?Melissa Nicholson is a 31 y.o. female who presents for the following: Acne (31 days isotretinoin f/u ). She had to start taking 40 mg every other day due to swelling of her face and skin irritation. She has been on it for 30 days and her acne has improved significantly. She is moisturizing regularly and will call if she has any problems. ? ? ?The following portions of the chart were reviewed this encounter and updated as appropriate:  Tobacco  Allergies  Meds  Problems  Med Hx  Surg Hx  Fam Hx   ?  ? ?Objective  ?Well appearing patient in no apparent distress; mood and affect are within normal limits. ? ?All skin waist up examined. ? ?Head - Anterior (Face) ?Hyperpigmentation and xerosis ? ? ?Assessment & Plan  ?Acne vulgaris ?Head - Anterior (Face) ? ?ISOtretinoin (ACCUTANE) 20 MG capsule - Head - Anterior (Face) ?Take 1 capsule (20 mg total) by mouth daily. ? ?Pregnancy, urine - Head - Anterior (Face) ? ?Encounter for therapeutic drug monitoring ? ?Related Procedures ?Pregnancy, urine ? ?Related Medications ?ISOtretinoin (ACCUTANE) 20 MG capsule ?Take 1 capsule (20 mg total) by mouth daily. ? ? ? ?I, Gerald Honea, PA-C, have reviewed all documentation's for this visit.  The documentation on 10/09/21 for the exam, diagnosis, procedures and orders are all accurate and complete. ?

## 2021-10-16 ENCOUNTER — Other Ambulatory Visit: Payer: Self-pay

## 2021-10-16 ENCOUNTER — Telehealth: Payer: Self-pay | Admitting: Physician Assistant

## 2021-10-16 DIAGNOSIS — K50119 Crohn's disease of large intestine with unspecified complications: Secondary | ICD-10-CM

## 2021-10-16 NOTE — Telephone Encounter (Signed)
Spoke with Melissa Nicholson in regards to patinet message of abdomen pain- verbal instructions per ks - patient can stop taking isotretinoin all together, drop from '20mg'$  everyday to '20mg'$  every other day or she can wait until she follows up with her GI doctor next week and them make a decision. Told patient to call us if needed.  ?

## 2021-10-16 NOTE — Telephone Encounter (Signed)
Patient calling because she has been having lower right quadrant and back pain for the last week. She states that she has recently started taking Accutane and wonders if these symptoms could be from the new medication. She states that she is a [facility] patient and has a GI appointment next week but wants to know if Vida Roller thinks she should do her Accutane blood work early to see if the new medication is having an effect on her liver/kidneys.  ?

## 2021-10-25 ENCOUNTER — Ambulatory Visit: Payer: BC Managed Care – PPO | Admitting: Gastroenterology

## 2021-10-25 ENCOUNTER — Encounter: Payer: Self-pay | Admitting: Gastroenterology

## 2021-10-25 VITALS — BP 122/68 | HR 64 | Ht 64.0 in | Wt 142.6 lb

## 2021-10-25 DIAGNOSIS — K603 Anal fistula: Secondary | ICD-10-CM | POA: Diagnosis not present

## 2021-10-25 DIAGNOSIS — K602 Anal fissure, unspecified: Secondary | ICD-10-CM | POA: Diagnosis not present

## 2021-10-25 NOTE — Patient Instructions (Signed)
It was my pleasure to provide care to you today. Based on our discussion, I am providing you with my recommendations below:  RECOMMENDATION(S):   REFERRAL:  A referral, your demographics, a copy of your insurance card and your records will be sent to [facility]. You will receive a call from their office regarding the date, time and location of your appointment.   BMI:  If you are age 33 or younger, your body mass index should be between 19-25. Your Body mass index is 24.48 kg/m. If this is out of the aformentioned range listed, please consider follow up with your Primary Care Provider.   MY CHART:  The [place] [facility] providers would like to encourage you to use [facility] to communicate with providers for non-urgent requests or questions.  Due to long hold times on the telephone, sending your provider a message by [facility] may be a faster and more efficient way to get a response.  Please allow 48 business hours for a response.  Please remember that this is for non-urgent requests.   Thank you for trusting me with your gastrointestinal care!    Thornton Park, MD, MPH

## 2021-10-25 NOTE — Progress Notes (Signed)
Referring Provider: Dineen Kid, MD Primary Care Physician:  Dineen Kid, MD  Chief complaint:  Crohn's disease   IMPRESSION:  Ileocolonic Crohn's disease with persistent perianal fistula: Started Humira 12/06/2020.  Remains on mesalamine 4.8 g daily.  Off prednisone.  Must consider addition of thiopurine. Discussed referral to tertiary care center to establish IBD care.   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   Anal fissures s/p chemical sphincterotomy with injection of perianal Botox on 06/21/2020  PLAN: Continue pantoprazole CBC every 3 months Fecal calprotectin to monitor response to therapy Continue Lialda 4.8 g daily and Humira 40 mg dosed every 2 weeks Calcium and vitamin D supplementations Annual dermatology head-to-toe exam scheduled in December Referral to [facility] - surgery evaluation for persistent perianal fistula Follow-up in 3 months, earlier if needed   Please see the "Patient Instructions" section for addition details about the plan.  HPI: Melissa Nicholson is a 31 y.o. female who returns in follow-up of Crohn's diagnosed on colonoscopy 09/19/20.  Recommended biologics for fistulizing disease. Insurance denied prior to failing treatment with Lialda and Entocort. Required switch from Entocort to prednisone due to ongoing symptoms. Started Humira 12/06/20. Feeling better since starting Humira. Fecal calprotectin normalized.   Has some increasing drainage from fistula a couple of weeks ago, but, symptoms resolved on their own without intervention. Having one formed bowel movement daily. Rare blood. Energy is better. Appete is better. Weight has increased.   Recent symptoms of globus were evaluated by ENT in [facility] who diagnosed her with LPR. Symptoms improved on pantoprazole.   IBD HISTORY:   Year of disease onset: 2022  Brief IBD Disease Course:  - 2022 - onset of perianal symptoms and diarrhea. Imaging and CT confirmed Crohn's. Fecal calprotectin 673  10/02/20. Insurance denied biologics. Started on Lialda and budeonside. No improvement. Required prednisone. Started Humira 12/06/20. Fecal calprotectin 06/06/21 19.    Endoscopy:  - Colonoscopy 09/19/20 - inflammation throughout the entire colon except for the distal sigmoid and rectum.  Biopsies of the right colon showed ulcer with moderate active colitis, transverse colon with focal active colitis, left colon and rectum without significant pathologic findings.  The TI was normal.  The pathology report was consistent with IBD. PATH = IBD  Imaging:  - Pelvic MRI w/wo contrast 09/03/2020: Small perianal fistula suggested arising from the inferior aspect of the sphincter complex the immediate perianal soft tissues, extending inferiorly and potentially into the gluteal cleft perhaps slightly more on the RIGHT than the LEFT. No drainable fluid. Signs of ileitis with ascites and interloop fluid of unclear etiology. Given constellation of findings would consider the possibility of inflammatory bowel disease. Ascites in the pelvis. - CT-E 09/11/20 - fairly marked wall thickening to the distal and terminal ileum suggestive of active Crohn's disease  Prior IBD medications (type, dose, duration, response): x 5-ASAs - Lialda x Oral corticosteroids - Prednisone, budesonide x Antibiotics - Cipro and Flagyl x Anti-TNF therapies - started Humira 12/06/20  IBD health maintenance: Influenza vaccine: Receives annually Pneumonia vaccine: Never Hepatitis B: Completed for vaccine TB testing: QuantiFERON 10/02/20 Chickenpox/Shingles history: Chicken pox in preschool Bone denistometry: None Derm appointment: Dermatologist, previously PRN Last small bowel imaging: 08/2020 Last colonoscopy: 08/2020 Not considering family planning for another 2-3 years   Extraintestinal manifestations:  -joint pains affecting: Bilateral knees, legs, and feet -eye: ? Dry eyes -skin: n -oral ulcers : Previously, last 5/22 -blood clots:  n -PSC: n -other: n     Past Medical  History:  Diagnosis Date   Anxiety    Atypical mole 06/13/2016   lower mid back mild   Chronic anal fissure    COVID 06/2019   sob, chest pain loss of taste and smell fever coughx 1 week all symptoms resolved   COVID-19 11/2020   Crohn's colitis ([place])    Fistula, anal    Wears glasses     Past Surgical History:  Procedure Laterality Date   ANAL FISSURE REPAIR     with botox   COLONOSCOPY     lumps removed from both breasts Bilateral 2011   fibrous adenomas benign   RECTAL EXAM UNDER ANESTHESIA N/A 06/21/2020   Procedure: ANORECTAL EXAM UNDER ANESTHESIA. INJECTION OF ANAL BOTOX;  Surgeon: Ileana Roup, MD;  Location: [facility];  Service: General;  Laterality: N/A;   WISDOM TOOTH EXTRACTION      Current Outpatient Medications  Medication Sig Dispense Refill   Adalimumab (HUMIRA PEN) 40 MG/0.4ML PNKT Inject 40 mg into the skin every 14 (fourteen) days. Begin day 29 2 each 6   citalopram (CELEXA) 20 MG tablet Take 20 mg by mouth daily.     ELDERBERRY PO Take by mouth. daily     ISOtretinoin (ACCUTANE) 20 MG capsule Take 1 capsule (20 mg total) by mouth daily. 30 capsule 0   LO LOESTRIN FE 1 MG-10 MCG / 10 MCG tablet Take 1 tablet by mouth daily.     mesalamine (LIALDA) 1.2 g EC tablet Take 4 tablets (4.8 g total) by mouth daily with breakfast. 360 tablet 1   OVER THE COUNTER MEDICATION Probiotic gummy- one daily     No current facility-administered medications for this visit.    Allergies as of 10/25/2021 - Review Complete 10/25/2021  Allergen Reaction Noted   Crab (diagnostic)  06/19/2020    Family History  Problem Relation Age of Onset   Heart block Father    Colon cancer Paternal Grandmother    Stomach cancer Neg Hx    Esophageal cancer Neg Hx    Pancreatic cancer Neg Hx    Rectal cancer Neg Hx      Physical Exam: General:   Alert,  well-nourished, pleasant and cooperative in NAD Head:   Normocephalic and atraumatic. Eyes:  Sclera clear, no icterus.   Conjunctiva pink. Abdomen:  Soft,nontender, nondistended, normal bowel sounds, no rebound or guarding. No hepatosplenomegaly.   Rectal:   No chemical dermatitis. Persistent anterior fissure and posterior fissures with overlying yellow exudate, small fistula orifice to the right posterior anal area. No exudate.  Neurologic:  Alert and  oriented x4;  grossly nonfocal Skin:  Intact without significant lesions or rashes. Psych:  Alert and cooperative. Normal mood and affect.     Marjean Imperato L. Tarri Glenn, MD, MPH 11/04/2021, 10:25 AM

## 2021-11-04 ENCOUNTER — Encounter: Payer: Self-pay | Admitting: Gastroenterology

## 2021-11-05 ENCOUNTER — Ambulatory Visit: Payer: BC Managed Care – PPO | Admitting: Physician Assistant

## 2021-11-05 ENCOUNTER — Encounter: Payer: Self-pay | Admitting: Physician Assistant

## 2021-11-05 DIAGNOSIS — L700 Acne vulgaris: Secondary | ICD-10-CM | POA: Diagnosis not present

## 2021-11-05 DIAGNOSIS — Z5181 Encounter for therapeutic drug level monitoring: Secondary | ICD-10-CM

## 2021-11-05 MED ORDER — ISOTRETINOIN 20 MG PO CAPS: 20.0000 mg | ORAL_CAPSULE | Freq: Every day | ORAL | 0 refills | Status: DC

## 2021-11-05 NOTE — Progress Notes (Signed)
   Follow-Up Visit   Subjective  Jamaiyah Pyle is a 31 y.o. female who presents for the following: Acne (Patient here today for 31 day follow up).   The following portions of the chart were reviewed this encounter and updated as appropriate:  Tobacco  Allergies  Meds  Problems  Med Hx  Surg Hx  Fam Hx      Objective  Well appearing patient in no apparent distress; mood and affect are within normal limits.  A focused examination was performed including face and hands. Relevant physical exam findings are noted in the Assessment and Plan.  chin Clear with one active cyst. Lips xerosis.   Assessment & Plan  Acne vulgaris chin  CBC with Differential/Platelet - chin  Comprehensive metabolic panel - chin  Lipid panel - chin  hCG, serum, qualitative - chin  ISOtretinoin (ACCUTANE) 20 MG capsule - chin Take 1 capsule (20 mg total) by mouth daily.  Encounter for therapeutic drug monitoring  Related Procedures CBC with Differential/Platelet Comprehensive metabolic panel Lipid panel hCG, serum, qualitative  Related Medications ISOtretinoin (ACCUTANE) 20 MG capsule Take 1 capsule (20 mg total) by mouth daily.    I, Suraiya Dickerson, PA-C, have reviewed all documentation's for this visit.  The documentation on 11/05/21 for the exam, diagnosis, procedures and orders are all accurate and complete.

## 2021-11-06 LAB — COMPREHENSIVE METABOLIC PANEL
AG Ratio: 1.4 (calc) (ref 1.0–2.5)
ALT: 14 U/L (ref 6–29)
AST: 15 U/L (ref 10–30)
Albumin: 4.3 g/dL (ref 3.6–5.1)
Alkaline phosphatase (APISO): 40 U/L (ref 31–125)
BUN: 8 mg/dL (ref 7–25)
CO2: 24 mmol/L (ref 20–32)
Calcium: 9.2 mg/dL (ref 8.6–10.2)
Chloride: 104 mmol/L (ref 98–110)
Creat: 0.76 mg/dL (ref 0.50–0.97)
Globulin: 3.1 g/dL (calc) (ref 1.9–3.7)
Glucose, Bld: 99 mg/dL (ref 65–99)
Potassium: 4 mmol/L (ref 3.5–5.3)
Sodium: 138 mmol/L (ref 135–146)
Total Bilirubin: 0.4 mg/dL (ref 0.2–1.2)
Total Protein: 7.4 g/dL (ref 6.1–8.1)

## 2021-11-06 LAB — CBC WITH DIFFERENTIAL/PLATELET
Absolute Monocytes: 291 cells/uL (ref 200–950)
Basophils Absolute: 21 cells/uL (ref 0–200)
Basophils Relative: 0.4 %
Eosinophils Absolute: 31 cells/uL (ref 15–500)
Eosinophils Relative: 0.6 %
HCT: 41.8 % (ref 35.0–45.0)
Hemoglobin: 13.6 g/dL (ref 11.7–15.5)
Lymphs Abs: 2246 cells/uL (ref 850–3900)
MCH: 29.8 pg (ref 27.0–33.0)
MCHC: 32.5 g/dL (ref 32.0–36.0)
MCV: 91.5 fL (ref 80.0–100.0)
MPV: 11.4 fL (ref 7.5–12.5)
Monocytes Relative: 5.6 %
Neutro Abs: 2610 cells/uL (ref 1500–7800)
Neutrophils Relative %: 50.2 %
Platelets: 214 10*3/uL (ref 140–400)
RBC: 4.57 10*6/uL (ref 3.80–5.10)
RDW: 11.8 % (ref 11.0–15.0)
Total Lymphocyte: 43.2 %
WBC: 5.2 10*3/uL (ref 3.8–10.8)

## 2021-11-06 LAB — HCG, SERUM, QUALITATIVE: Preg, Serum: NEGATIVE

## 2021-11-06 LAB — LIPID PANEL
Cholesterol: 233 mg/dL — ABNORMAL HIGH (ref <200)
HDL: 55 mg/dL (ref >=50)
LDL Cholesterol (Calc): 147 mg/dL (calc) — ABNORMAL HIGH
Non-HDL Cholesterol (Calc): 178 mg/dL (calc) — ABNORMAL HIGH (ref <130)
Total CHOL/HDL Ratio: 4.2 (calc) (ref <5.0)
Triglycerides: 175 mg/dL — ABNORMAL HIGH (ref <150)

## 2021-11-07 ENCOUNTER — Telehealth: Payer: Self-pay | Admitting: *Deleted

## 2021-11-07 ENCOUNTER — Encounter: Payer: Self-pay | Admitting: Gastroenterology

## 2021-11-07 NOTE — Telephone Encounter (Signed)
Called patient to let her know her lips were elevated- per kelli sheffield avoid sugar, fat and alcohol consumption and we'll recheck in 1 month.

## 2021-11-07 NOTE — Telephone Encounter (Signed)
-----   Message from Warren Danes, [place] sent at 11/07/2021  3:10 PM EDT ----- Elevated. Watch sugar, alcohol and fat intake.  Recheck 1 month.

## 2021-11-11 ENCOUNTER — Encounter: Payer: Self-pay | Admitting: *Deleted

## 2021-11-11 NOTE — Telephone Encounter (Signed)
Melissa Nicholson placed referral.

## 2021-11-11 NOTE — Telephone Encounter (Signed)
Melissa Nicholson note said it could take 2 weeks. She was referring her to [facility]

## 2021-11-16 ENCOUNTER — Other Ambulatory Visit: Payer: Self-pay | Admitting: Gastroenterology

## 2021-11-16 DIAGNOSIS — K602 Anal fissure, unspecified: Secondary | ICD-10-CM

## 2021-11-16 DIAGNOSIS — K50119 Crohn's disease of large intestine with unspecified complications: Secondary | ICD-10-CM

## 2021-11-21 ENCOUNTER — Other Ambulatory Visit (INDEPENDENT_AMBULATORY_CARE_PROVIDER_SITE_OTHER): Payer: BC Managed Care – PPO

## 2021-11-21 DIAGNOSIS — K50119 Crohn's disease of large intestine with unspecified complications: Secondary | ICD-10-CM | POA: Diagnosis not present

## 2021-11-21 LAB — CBC
HCT: 37.5 % (ref 36.0–46.0)
Hemoglobin: 12.7 g/dL (ref 12.0–15.0)
MCHC: 33.9 g/dL (ref 30.0–36.0)
MCV: 91.2 fl (ref 78.0–100.0)
Platelets: 191 10*3/uL (ref 150.0–400.0)
RBC: 4.11 Mil/uL (ref 3.87–5.11)
RDW: 12.5 % (ref 11.5–15.5)
WBC: 6.3 10*3/uL (ref 4.0–10.5)

## 2021-11-25 ENCOUNTER — Other Ambulatory Visit: Payer: BC Managed Care – PPO

## 2021-11-25 DIAGNOSIS — K50119 Crohn's disease of large intestine with unspecified complications: Secondary | ICD-10-CM | POA: Diagnosis not present

## 2021-11-30 LAB — CALPROTECTIN, FECAL: Calprotectin, Fecal: 12 ug/g (ref 0–120)

## 2021-12-09 ENCOUNTER — Ambulatory Visit: Payer: BC Managed Care – PPO | Admitting: Dermatology

## 2021-12-09 ENCOUNTER — Encounter: Payer: Self-pay | Admitting: Dermatology

## 2021-12-09 DIAGNOSIS — L700 Acne vulgaris: Secondary | ICD-10-CM | POA: Diagnosis not present

## 2021-12-09 DIAGNOSIS — Z5181 Encounter for therapeutic drug level monitoring: Secondary | ICD-10-CM

## 2021-12-09 MED ORDER — ISOTRETINOIN 20 MG PO CAPS: 20.0000 mg | ORAL_CAPSULE | Freq: Every day | ORAL | 0 refills | Status: DC

## 2021-12-10 ENCOUNTER — Telehealth: Payer: Self-pay | Admitting: Dermatology

## 2021-12-10 ENCOUNTER — Encounter: Payer: Self-pay | Admitting: Dermatology

## 2021-12-10 LAB — PREGNANCY, URINE: Preg Test, Ur: NEGATIVE

## 2021-12-10 NOTE — Progress Notes (Signed)
   Isotretinoin Follow-Up Visit   Subjective  Melissa Nicholson is a 31 y.o. female who presents for the following: Acne (31 days [place] the beach this [place]).  Follow-up acne on isotretinoin Location:  Duration:  Quality:  Associated Signs/Symptoms: Modifying Factors:  Severity:  Timing: Context:   The following portions of the chart were reviewed this encounter and updated as appropriate:  Tobacco  Allergies  Meds  Problems  Med Hx  Surg Hx  Fam Hx        Objective  Well appearing patient in no apparent distress; mood and affect are within normal limits.  A focused examination was performed including head and neck. Relevant physical exam findings are noted in the Assessment and Plan.  Head - Anterior (Face) Active deep inflammatory acne essentially clear.  I did have minor sunburn.  Tolerable cheilitis.  No achiness.   Assessment & Plan  Acne vulgaris Head - Anterior (Face)  Dr Pearline Cables over the counter lip balm.  Continue isotretinoin.  Reminded of need for strict sun protection and I pledge compliance.  Related Procedures Pregnancy, urine  Related Medications ISOtretinoin (ACCUTANE) 20 MG capsule Take 1 capsule (20 mg total) by mouth daily.  Encounter for therapeutic drug monitoring  Related Procedures Pregnancy, urine  Related Medications ISOtretinoin (ACCUTANE) 20 MG capsule Take 1 capsule (20 mg total) by mouth daily.

## 2021-12-10 NOTE — Telephone Encounter (Signed)
Phone call to patient to let her know she is qualified to receive isotretinoin.

## 2021-12-10 NOTE — Telephone Encounter (Signed)
Patient is calling to say that she is having trouble with iPledge.  [name] is saying that the physician's office has not done their part.

## 2021-12-11 ENCOUNTER — Telehealth: Payer: Self-pay | Admitting: *Deleted

## 2021-12-11 NOTE — Telephone Encounter (Signed)
Phone call from patient stating [facility] [address] [phone] dispensed '40mg'$  isotretinoin instead of '20mg'$ . [facility] and spoke with scott ( pharmacy tech) and they did dispense wrong dose- scott said patient can bring back 40 mg and they will dispense the correct '20mg'$  dose- called patient to let her know of pharmacy recommendation.

## 2021-12-27 ENCOUNTER — Ambulatory Visit: Payer: BC Managed Care – PPO | Admitting: Gastroenterology

## 2022-01-01 ENCOUNTER — Ambulatory Visit: Payer: BC Managed Care – PPO | Admitting: Gastroenterology

## 2022-01-09 ENCOUNTER — Encounter: Payer: Self-pay | Admitting: Dermatology

## 2022-01-09 ENCOUNTER — Ambulatory Visit: Payer: BC Managed Care – PPO | Admitting: Dermatology

## 2022-01-09 VITALS — Wt 142.0 lb

## 2022-01-09 DIAGNOSIS — L853 Xerosis cutis: Secondary | ICD-10-CM

## 2022-01-09 DIAGNOSIS — L700 Acne vulgaris: Secondary | ICD-10-CM

## 2022-01-09 DIAGNOSIS — K130 Diseases of lips: Secondary | ICD-10-CM | POA: Diagnosis not present

## 2022-01-09 DIAGNOSIS — Z79899 Other long term (current) drug therapy: Secondary | ICD-10-CM

## 2022-01-09 MED ORDER — PIMECROLIMUS 1 % EX CREA: TOPICAL_CREAM | Freq: Two times a day (BID) | CUTANEOUS | 0 refills | Status: DC

## 2022-01-09 MED ORDER — ISOTRETINOIN 30 MG PO CAPS: 30.0000 mg | ORAL_CAPSULE | Freq: Every day | ORAL | 0 refills | Status: DC

## 2022-01-09 NOTE — Patient Instructions (Addendum)
While taking Isotretinoin and for 30 days after you finish the medication, do not get pregnant, do not share pills, do not donate blood.  Generic isotretinoin is best absorbed when taken with a fatty meal. Isotretinoin can make you sensitive to the sun. Daily careful sun protection including sunscreen SPF 30+ when outdoors is recommended.   Start Claravis 30 mg 1 capsule once daily with fatty meal.   Start Elidel (Pimecrolimus) twice daily to lips  Recommend taking Heliocare sun protection supplement daily in sunny weather for additional sun protection. For maximum protection on the sunniest days, you can take up to 2 capsules of regular Heliocare OR take 1 capsule of Heliocare Ultra. For prolonged exposure (such as a full day in the sun), you can repeat your dose of the supplement 4 hours after your first dose. Heliocare can be purchased at [employer], at some [facility] or at [email or web address].     Due to recent changes in healthcare laws, you may see results of your pathology and/or laboratory studies on MyChart before the doctors have had a chance to review them. We understand that in some cases there may be results that are confusing or concerning to you. Please understand that not all results are received at the same time and often the doctors may need to interpret multiple results in order to provide you with the best plan of care or course of treatment. Therefore, we ask that you please give [place] 2 business days to thoroughly review all your results before contacting the office for clarification. Should we see a critical lab result, you will be contacted sooner.   If You Need Anything After Your Visit  If you have any questions or concerns for your doctor, please call our main line at [phone] and press option 4 to reach your doctor's medical assistant. If no one answers, please leave a voicemail as directed and we will return your call as soon as possible. Messages left after 4 pm  will be answered the following business day.   You may also send [place] a message via [place]. We typically respond to MyChart messages within 1-2 business days.  For prescription refills, please ask your pharmacy to contact our office. Our fax number is [phone].  If you have an urgent issue when the clinic is closed that cannot wait until the next business day, you can page your doctor at the number below.    Please note that while we do our best to be available for urgent issues outside of office hours, we are not available 24/7.   If you have an urgent issue and are unable to reach [place], you may choose to seek medical care at your doctor's office, retail clinic, urgent care center, or emergency room.  If you have a medical emergency, please immediately call 911 or go to the emergency department.  Pager Numbers  - Dr. Nehemiah Massed: [phone]  - Dr. Laurence Ferrari: [phone]  - Dr. Nicole Kindred: [phone]  In the event of inclement weather, please call our main line at [phone] for an update on the status of any delays or closures.  Dermatology Medication Tips: Please keep the boxes that topical medications come in in order to help keep track of the instructions about where and how to use these. Pharmacies typically print the medication instructions only on the boxes and not directly on the medication tubes.   If your medication is too expensive, please contact our office at [phone] option 4 or send [place] a  message through [place].   We are unable to tell what your co-pay for medications will be in advance as this is different depending on your insurance coverage. However, we may be able to find a substitute medication at lower cost or fill out paperwork to get insurance to cover a needed medication.   If a prior authorization is required to get your medication covered by your insurance company, please allow [place] 1-2 business days to complete this process.  Drug prices often vary depending  on where the prescription is filled and some pharmacies may offer cheaper prices.  The website www.goodrx.com contains coupons for medications through different pharmacies. The prices here do not account for what the cost may be with help from insurance (it may be cheaper with your insurance), but the website can give you the price if you did not use any insurance.  - You can print the associated coupon and take it with your prescription to the pharmacy.  - You may also stop by our office during regular business hours and pick up a GoodRx coupon card.  - If you need your prescription sent electronically to a different pharmacy, notify our office through [facility] or by phone at [phone] option 4.     Si Usted Necesita Algo Despus de Su Visita  Tambin puede enviarnos un mensaje a travs de Pharmacist, community. Por lo general respondemos a los mensajes de MyChart en el transcurso de 1 a 2 das hbiles.  Para renovar recetas, por favor pida a su farmacia que se ponga en contacto con nuestra oficina. Harland Dingwall de fax es [place] [phone].  Si tiene un asunto urgente cuando la clnica est cerrada y que no puede esperar hasta el siguiente da hbil, puede llamar/localizar a su doctor(a) al nmero que aparece a continuacin.   Por favor, tenga en cuenta que aunque hacemos todo lo posible para estar disponibles para asuntos urgentes fuera del horario de [place], no estamos disponibles las 24 horas del da, los 7 das de la [place].   Si tiene un problema urgente y no puede comunicarse con nosotros, puede optar por buscar atencin mdica  en el consultorio de su doctor(a), en una clnica privada, en un centro de atencin urgente o en una sala de emergencias.  Si tiene Engineering geologist, por favor llame inmediatamente al 911 o vaya a la sala de emergencias.  Nmeros de bper  - Dr. Nehemiah Massed: [phone]  - Dra. Moye: [phone]  - Dra. Nicole Kindred: [phone]  En caso de  inclemencias del [place], por favor llame a Johnsie Kindred principal al [phone] para una actualizacin sobre el [place] de cualquier retraso o cierre.  Consejos para la medicacin en dermatologa: Por favor, guarde las cajas en las que vienen los medicamentos de uso tpico para ayudarle a seguir las instrucciones sobre dnde y cmo usarlos. Las farmacias generalmente imprimen las instrucciones del medicamento slo en las cajas y no directamente en los tubos del [place].   Si su medicamento es muy caro, por favor, pngase en contacto con Zigmund Daniel llamando al [phone] y presione la opcin 4 o envenos un mensaje a travs de Pharmacist, community.   No podemos decirle cul ser su copago por los medicamentos por adelantado ya que esto es diferente dependiendo de la cobertura de su seguro. Sin embargo, es posible que podamos encontrar un medicamento sustituto a Electrical engineer un formulario para que el seguro cubra el medicamento que se considera necesario.   Si se requiere [employer]  autorizacin previa para que su compaa de seguros [place] su medicamento, por favor permtanos de 1 a 2 das hbiles para completar este proceso.  Los precios de los medicamentos varan con frecuencia dependiendo del Environmental consultant de dnde se surte la receta y alguna farmacias pueden ofrecer precios ms baratos.  El sitio web www.goodrx.com tiene cupones para medicamentos de Airline pilot. Los precios aqu no tienen en cuenta lo que podra costar con la ayuda del seguro (puede ser ms barato con su seguro), pero el sitio web puede darle el precio si no utiliz Research scientist (physical sciences).  - Puede imprimir el cupn correspondiente y llevarlo con su receta a la farmacia.  - Tambin puede pasar por nuestra oficina durante el horario de atencin regular y Charity fundraiser una tarjeta de cupones de GoodRx.  - Si necesita que su receta se enve electrnicamente a una farmacia diferente, informe a nuestra oficina a travs de MyChart de [place] o  por telfono llamando al [phone] y presione la opcin 4.

## 2022-01-09 NOTE — Progress Notes (Signed)
Isotretinoin Follow-Up Visit   Subjective  Melissa Nicholson is a 31 y.o. female who presents for the following: Acne (Isotretinoin patient. Transferring from [facility]. Currently taking 20 mg daily. Unable to tolerate 40 mg. End of 4th month).  Week # 16 iPledge: [id number] [facility] [place] Current Total Dosage: 2600 mg Total mg/kg: 40.37 mg/kg    Isotretinoin F/U - 01/09/22 1200       Isotretinoin Follow Up   iPledge # [id number]    Date 01/09/22    Weight 142 lb (64.4 kg)    Two Forms of Birth Control Oral Contraceptives (w/ estrogen);Female Condom      Dosage   Target Dosage (mg) 7363    Current (To Date) Dosage (mg) 2600    To Go Dosage (mg) 4763      Side Effects   Skin Chapped Lips;Dry Lips;Dry Nose;Sunburn    Gastrointestinal WNL    Neurological WNL    Constitutional WNL             Side effects: Dry skin, dry lips  Denies changes in night vision, shortness of breath, abdominal pain, nausea, vomiting, diarrhea, blood in stool or urine, visual changes, headaches, epistaxis, joint pain, myalgias, mood changes, depression, or suicidal ideation.   Patient is not pregnant, not seeking pregnancy, and not breastfeeding.   The following portions of the chart were reviewed this encounter and updated as appropriate: medications, allergies, medical history  Review of Systems:  No other skin or systemic complaints except as noted in HPI or Assessment and Plan.  Objective  Well appearing patient in no apparent distress; mood and affect are within normal limits.  An examination of the face, neck, chest, and back was performed and relevant findings are noted below.   face, chest, back Face with trace open comedones, chest with rare open comedone, back clear    Assessment & Plan   Acne vulgaris face, chest, back  Acne is severe and chronic (present >1 year); patient is currently on Isotretinoin, requiring FDA  mandated monthly evaluations and laboratory monitoring, and not to goal (must reach target dose based on weight and also have clear skin for 2 months prior to discontinuation in order to help prevent relapse)   While taking Isotretinoin and for 30 days after you finish the medication, do not get pregnant, do not share pills, do not donate blood.  Generic isotretinoin is best absorbed when taken with a fatty meal. Isotretinoin can make you sensitive to the sun. Daily careful sun protection including sunscreen SPF 30+ when outdoors is recommended.   Start Claravis 30 mg 1 capsule once daily with fatty meal.  Plan to repeat lipid panel in 1-2 months. Family H/O hyperlipidemia.  Week # 16 iPledge: [id number] [facility] [place] Current Total Dosage: 2600 mg Total mg/kg: 40.37 mg/kg  Patient confirmed in iPledge and isotretinoin sent to pharmacy.   Consider switch to Absorica or generic Absorica at [place] at next appointment.  ISOtretinoin (CLARAVIS) 30 MG capsule - face, chest, back Take 1 capsule (30 mg total) by mouth daily. Take with food    Xerosis secondary to isotretinoin therapy - Continue emollients as directed  Cheilitis secondary to isotretinoin therapy - Continue lip balm as directed, Dr. Luvenia Heller Cortibalm recommended Start Elidel cream twice daily to lips as needed  Long term medication management (isotretinoin) - While taking Isotretinoin and for 30 days after you finish the medication, do not get  pregnant, do not share pills, do not donate blood. Isotretinoin is best absorbed when taken with a fatty meal. Isotretinoin can make you sensitive to the sun. Daily careful sun protection including sunscreen SPF 30+ when outdoors is recommended.  Follow-up in 30 days.  I, Emelia Salisbury, CMA, am acting as scribe for Forest Gleason, MD.  Documentation: I have reviewed the above documentation for accuracy and completeness, and I agree with the  above.  Forest Gleason, MD

## 2022-01-14 ENCOUNTER — Ambulatory Visit: Payer: BC Managed Care – PPO | Admitting: Physician Assistant

## 2022-01-14 ENCOUNTER — Telehealth: Payer: Self-pay

## 2022-01-14 NOTE — Telephone Encounter (Signed)
Patient called this morning and thinks that she is having side effects to her accutane. She states that she notices that she has vision changes and then immediately afterwards gets a headache. She said that has happened Sunday and yesterday. Please advise.

## 2022-01-14 NOTE — Telephone Encounter (Signed)
Spoke with patient. She reports some vision changes (spots and tunnel vision) followed by headache on Sunday and then again on Monday. Both headaches and vision changes resolved and she is feeling fine now. On Sunday, she was still on the same 20 mg dose of isotretinoin she has been taking for a few months.  Advised that this is less likely to be related to the isotretinoin as long as it is not worsening and headaches or vision changes are not persistent.  Advised if she has worsening, increased frequency, or headache or vision changes that do not go away promptly, to stop the medicine right away and we would send right away for ophthalmology evaluation.   Recommend follow-up with PCP for headaches otherwise. All questions answered.

## 2022-01-20 ENCOUNTER — Encounter: Payer: Self-pay | Admitting: Dermatology

## 2022-01-20 DIAGNOSIS — K130 Diseases of lips: Secondary | ICD-10-CM

## 2022-01-21 MED ORDER — PIMECROLIMUS 1 % EX CREA: TOPICAL_CREAM | Freq: Two times a day (BID) | CUTANEOUS | 0 refills | Status: DC

## 2022-01-22 ENCOUNTER — Encounter: Payer: Self-pay | Admitting: Dermatology

## 2022-01-27 ENCOUNTER — Encounter: Payer: Self-pay | Admitting: Gastroenterology

## 2022-02-04 ENCOUNTER — Other Ambulatory Visit: Payer: BC Managed Care – PPO

## 2022-02-04 ENCOUNTER — Telehealth: Payer: Self-pay | Admitting: Gastroenterology

## 2022-02-04 ENCOUNTER — Other Ambulatory Visit: Payer: Self-pay

## 2022-02-04 DIAGNOSIS — K602 Anal fissure, unspecified: Secondary | ICD-10-CM | POA: Diagnosis not present

## 2022-02-04 DIAGNOSIS — R197 Diarrhea, unspecified: Secondary | ICD-10-CM | POA: Diagnosis not present

## 2022-02-04 DIAGNOSIS — K50119 Crohn's disease of large intestine with unspecified complications: Secondary | ICD-10-CM

## 2022-02-04 DIAGNOSIS — K625 Hemorrhage of anus and rectum: Secondary | ICD-10-CM

## 2022-02-04 DIAGNOSIS — K603 Anal fistula: Secondary | ICD-10-CM | POA: Diagnosis not present

## 2022-02-04 NOTE — Telephone Encounter (Signed)
Pt called and states she is having a flare up since being on humira, she says she went to the bathroom 4 times yesterday. Pt is requesting a call back for further advice, thank you.

## 2022-02-04 NOTE — Telephone Encounter (Signed)
Spoke with patient regarding MD recommendations. She plans to come in today to pick up stool samples & draw labs. OV scheduled for soonest available on 03/05/22 at 10:00 am with Jaclyn Shaggy, NP. Pt added to wait list as well.

## 2022-02-04 NOTE — Telephone Encounter (Signed)
Patient called in stating that on 01/31/22-02/01/22 she was experiencing bloody, loose stools & abdominal cramping. Since then she has not experienced any further bleeding, however yesterday had diarrhea 4 times during the day. She has also noticed she is having some joint discomfort in her hips, and is wondering if this could be a flare up. She is currently taking Humira & Lialda as prescribed. Patient is scheduled to see Dr. Carlos Levering with IBD center on 05/01/22. Will send to MD for further recommendations.

## 2022-02-04 NOTE — Telephone Encounter (Signed)
Left message for patient to call back  

## 2022-02-05 ENCOUNTER — Other Ambulatory Visit: Payer: BC Managed Care – PPO

## 2022-02-05 DIAGNOSIS — K625 Hemorrhage of anus and rectum: Secondary | ICD-10-CM

## 2022-02-05 DIAGNOSIS — K602 Anal fissure, unspecified: Secondary | ICD-10-CM

## 2022-02-05 DIAGNOSIS — A048 Other specified bacterial intestinal infections: Secondary | ICD-10-CM | POA: Diagnosis not present

## 2022-02-05 DIAGNOSIS — R197 Diarrhea, unspecified: Secondary | ICD-10-CM

## 2022-02-05 DIAGNOSIS — K603 Anal fistula: Secondary | ICD-10-CM | POA: Diagnosis not present

## 2022-02-05 DIAGNOSIS — Z0389 Encounter for observation for other suspected diseases and conditions ruled out: Secondary | ICD-10-CM | POA: Diagnosis not present

## 2022-02-05 DIAGNOSIS — K50119 Crohn's disease of large intestine with unspecified complications: Secondary | ICD-10-CM

## 2022-02-09 LAB — CALPROTECTIN, FECAL: Calprotectin, Fecal: 42 ug/g (ref 0–120)

## 2022-02-09 LAB — GI PROFILE, STOOL, PCR

## 2022-02-09 LAB — SERIAL MONITORING

## 2022-02-10 LAB — ADALIMUMAB+AB (SERIAL MONITOR)
Adalimumab Drug Level: 11 ug/mL
Anti-Adalimumab Antibody: <25 ng/mL

## 2022-02-11 ENCOUNTER — Encounter: Payer: Self-pay | Admitting: Dermatology

## 2022-02-11 ENCOUNTER — Ambulatory Visit: Payer: BC Managed Care – PPO | Admitting: Dermatology

## 2022-02-11 VITALS — Wt 142.0 lb

## 2022-02-11 DIAGNOSIS — L700 Acne vulgaris: Secondary | ICD-10-CM | POA: Diagnosis not present

## 2022-02-11 DIAGNOSIS — Z79899 Other long term (current) drug therapy: Secondary | ICD-10-CM

## 2022-02-11 DIAGNOSIS — L853 Xerosis cutis: Secondary | ICD-10-CM | POA: Diagnosis not present

## 2022-02-11 DIAGNOSIS — K130 Diseases of lips: Secondary | ICD-10-CM | POA: Diagnosis not present

## 2022-02-11 MED ORDER — ISOTRETINOIN 40 MG PO CAPS: 40.0000 mg | ORAL_CAPSULE | Freq: Every day | ORAL | 0 refills | Status: DC

## 2022-02-11 NOTE — Progress Notes (Signed)
Isotretinoin Follow-Up Visit   Subjective  Melissa Nicholson is a 31 y.o. female who presents for the following: Acne (Isotretinoin therapy. End of 5th month. Taking 30 mg daily, tolerating well. Using Dr. Luvenia Heller Cortibalm and Pimecrolimus as directed on lips).  Week # 20   Isotretinoin F/U - 02/11/22 0800       Isotretinoin Follow Up   iPledge # [id number]    Date 02/11/22    Weight 142 lb (64.4 kg)    Two Forms of Birth Control Oral Contraceptives (w/ estrogen);Female Condom      Dosage   Target Dosage (mg) 7363    Current (To Date) Dosage (mg) 3500    To Go Dosage (mg) 3863      Side Effects   Skin Chapped Lips;Dry Lips;Dry Nose    Gastrointestinal WNL    Neurological Headache    Constitutional WNL             Side effects: Dry skin, dry lips  Denies changes in night vision, shortness of breath, abdominal pain, nausea, vomiting, diarrhea, blood in stool or urine, visual changes, headaches, epistaxis, joint pain, myalgias, mood changes, depression, or suicidal ideation.   Patient is not pregnant, not seeking pregnancy, and not breastfeeding.   The following portions of the chart were reviewed this encounter and updated as appropriate: medications, allergies, medical history  Review of Systems:  No other skin or systemic complaints except as noted in HPI or Assessment and Plan.  Objective  Well appearing patient in no apparent distress; mood and affect are within normal limits.  An examination of the face, neck, chest, and back was performed and relevant findings are noted below.   face, chest, back Trace open comedones, rare resolving papule at face, back, chest    Assessment & Plan   Acne vulgaris face, chest, back  Acne is severe and chronic (present >1 year); patient is currently on Isotretinoin, requiring FDA mandated monthly evaluations and laboratory monitoring, and not to goal (must reach target dose based on weight and also have clear skin for 2  months prior to discontinuation in order to help prevent relapse)    While taking Isotretinoin and for 30 days after you finish the medication, do not get pregnant, do not share pills, do not donate blood.  Generic isotretinoin is best absorbed when taken with a fatty meal. Isotretinoin can make you sensitive to the sun. Daily careful sun protection including sunscreen SPF 30+ when outdoors is recommended.    Start Claravis 40 mg 1 capsule once daily with fatty meal.   Plan to repeat lipid panel in next month. Family H/O hyperlipidemia.   Week # 20 iPledge: [id number] Pharmacy:  [facility] [place] Current Total Dosage: 3500 mg Total mg/kg: 54.35 mg/kg   Patient confirmed in iPledge and isotretinoin sent to pharmacy.   Urine pregnancy test performed in office today and was negative.  Patient demonstrates comprehension and confirms she will not get pregnant.    ISOtretinoin (CLARAVIS) 40 MG capsule - face, chest, back Take 1 capsule (40 mg total) by mouth daily. Take with fatty meal    Xerosis secondary to isotretinoin therapy - Continue emollients as directed  Cheilitis secondary to isotretinoin therapy - Continue lip balm as directed, Dr. Luvenia Heller Cortibalm recommended  Long term medication management (isotretinoin) - While taking Isotretinoin and for 30 days after you finish the medication, do not get pregnant, do not share pills, do not donate blood. Isotretinoin is best  absorbed when taken with a fatty meal. Isotretinoin can make you sensitive to the sun. Daily careful sun protection including sunscreen SPF 30+ when outdoors is recommended.  Follow-up in 30 days.  I, Emelia Salisbury, CMA, am acting as scribe for Forest Gleason, MD.  Documentation: I have reviewed the above documentation for accuracy and completeness, and I agree with the above.  Forest Gleason, MD

## 2022-02-11 NOTE — Patient Instructions (Signed)
While taking Isotretinoin and for 30 days after you finish the medication, do not get pregnant, do not share pills, do not donate blood.  Generic isotretinoin is best absorbed when taken with a fatty meal. Isotretinoin can make you sensitive to the sun. Daily careful sun protection including sunscreen SPF 30+ when outdoors is recommended.    Due to recent changes in healthcare laws, you may see results of your pathology and/or laboratory studies on MyChart before the doctors have had a chance to review them. We understand that in some cases there may be results that are confusing or concerning to you. Please understand that not all results are received at the same time and often the doctors may need to interpret multiple results in order to provide you with the best plan of care or course of treatment. Therefore, we ask that you please give [place] 2 business days to thoroughly review all your results before contacting the office for clarification. Should we see a critical lab result, you will be contacted sooner.   If You Need Anything After Your Visit  If you have any questions or concerns for your doctor, please call our main line at [phone] and press option 4 to reach your doctor's medical assistant. If no one answers, please leave a voicemail as directed and we will return your call as soon as possible. Messages left after 4 pm will be answered the following business day.   You may also send [place] a message via [place]. We typically respond to MyChart messages within 1-2 business days.  For prescription refills, please ask your pharmacy to contact our office. Our fax number is [phone].  If you have an urgent issue when the clinic is closed that cannot wait until the next business day, you can page your doctor at the number below.    Please note that while we do our best to be available for urgent issues outside of office hours, we are not available 24/7.   If you have an urgent issue and are  unable to reach [place], you may choose to seek medical care at your doctor's office, retail clinic, urgent care center, or emergency room.  If you have a medical emergency, please immediately call 911 or go to the emergency department.  Pager Numbers  - Dr. Nehemiah Massed: [phone]  - Dr. Laurence Ferrari: [phone]  - Dr. Nicole Kindred: [phone]  In the event of inclement weather, please call our main line at [phone] for an update on the status of any delays or closures.  Dermatology Medication Tips: Please keep the boxes that topical medications come in in order to help keep track of the instructions about where and how to use these. Pharmacies typically print the medication instructions only on the boxes and not directly on the medication tubes.   If your medication is too expensive, please contact our office at [phone] option 4 or send [place] a message through [place].   We are unable to tell what your co-pay for medications will be in advance as this is different depending on your insurance coverage. However, we may be able to find a substitute medication at lower cost or fill out paperwork to get insurance to cover a needed medication.   If a prior authorization is required to get your medication covered by your insurance company, please allow [place] 1-2 business days to complete this process.  Drug prices often vary depending on where the prescription is filled and some pharmacies may offer cheaper prices.  The  website www.goodrx.com contains coupons for medications through different pharmacies. The prices here do not account for what the cost may be with help from insurance (it may be cheaper with your insurance), but the website can give you the price if you did not use any insurance.  - You can print the associated coupon and take it with your prescription to the pharmacy.  - You may also stop by our office during regular business hours and pick up a GoodRx coupon card.  - If you need your  prescription sent electronically to a different pharmacy, notify our office through [facility] or by phone at [phone] option 4.     Si Usted Necesita Algo Despus de Su Visita  Tambin puede enviarnos un mensaje a travs de Pharmacist, community. Por lo general respondemos a los mensajes de MyChart en el transcurso de 1 a 2 das hbiles.  Para renovar recetas, por favor pida a su farmacia que se ponga en contacto con nuestra oficina. Harland Dingwall de fax es [place] [phone].  Si tiene un asunto urgente cuando la clnica est cerrada y que no puede esperar hasta el siguiente da hbil, puede llamar/localizar a su doctor(a) al nmero que aparece a continuacin.   Por favor, tenga en cuenta que aunque hacemos todo lo posible para estar disponibles para asuntos urgentes fuera del horario de [place], no estamos disponibles las 24 horas del da, los 7 das de la [place].   Si tiene un problema urgente y no puede comunicarse con nosotros, puede optar por buscar atencin mdica  en el consultorio de su doctor(a), en una clnica privada, en un centro de atencin urgente o en una sala de emergencias.  Si tiene Engineering geologist, por favor llame inmediatamente al 911 o vaya a la sala de emergencias.  Nmeros de bper  - Dr. Nehemiah Massed: [phone]  - Dra. Moye: [phone]  - Dra. Nicole Kindred: [phone]  En caso de inclemencias del [place], por favor llame a Johnsie Kindred principal al [phone] para una actualizacin sobre el [place] de cualquier retraso o cierre.  Consejos para la medicacin en dermatologa: Por favor, guarde las cajas en las que vienen los medicamentos de uso tpico para ayudarle a seguir las instrucciones sobre dnde y cmo usarlos. Las farmacias generalmente imprimen las instrucciones del medicamento slo en las cajas y no directamente en los tubos del [place].   Si su medicamento es muy caro, por favor, pngase en contacto con Zigmund Daniel llamando al [phone]  y presione la opcin 4 o envenos un mensaje a travs de Pharmacist, community.   No podemos decirle cul ser su copago por los medicamentos por adelantado ya que esto es diferente dependiendo de la cobertura de su seguro. Sin embargo, es posible que podamos encontrar un medicamento sustituto a Electrical engineer un formulario para que el seguro cubra el medicamento que se considera necesario.   Si se requiere una autorizacin previa para que su compaa de seguros [place] su medicamento, por favor permtanos de 1 a 2 das hbiles para completar este proceso.  Los precios de los medicamentos varan con frecuencia dependiendo del Environmental consultant de dnde se surte la receta y alguna farmacias pueden ofrecer precios ms baratos.  El sitio web www.goodrx.com tiene cupones para medicamentos de Airline pilot. Los precios aqu no tienen en cuenta lo que podra costar con la ayuda del seguro (puede ser ms barato con su seguro), pero el sitio web puede darle el precio si no utiliz Research scientist (physical sciences).  - Puede  imprimir el cupn correspondiente y llevarlo con su receta a la farmacia.  - Tambin puede pasar por nuestra oficina durante el horario de atencin regular y Charity fundraiser una tarjeta de cupones de GoodRx.  - Si necesita que su receta se enve electrnicamente a una farmacia diferente, informe a nuestra oficina a travs de MyChart de [place] o por telfono llamando al [phone] y presione la opcin 4.

## 2022-02-17 ENCOUNTER — Encounter: Payer: Self-pay | Admitting: Dermatology

## 2022-02-18 ENCOUNTER — Ambulatory Visit: Payer: BC Managed Care – PPO | Admitting: Physician Assistant

## 2022-02-24 DIAGNOSIS — Z23 Encounter for immunization: Secondary | ICD-10-CM | POA: Diagnosis not present

## 2022-02-24 DIAGNOSIS — H10021 Other mucopurulent conjunctivitis, right eye: Secondary | ICD-10-CM | POA: Diagnosis not present

## 2022-02-28 DIAGNOSIS — Z124 Encounter for screening for malignant neoplasm of cervix: Secondary | ICD-10-CM | POA: Diagnosis not present

## 2022-02-28 DIAGNOSIS — Z1151 Encounter for screening for human papillomavirus (HPV): Secondary | ICD-10-CM | POA: Diagnosis not present

## 2022-02-28 DIAGNOSIS — Z01419 Encounter for gynecological examination (general) (routine) without abnormal findings: Secondary | ICD-10-CM | POA: Diagnosis not present

## 2022-02-28 DIAGNOSIS — Z6823 Body mass index (BMI) 23.0-23.9, adult: Secondary | ICD-10-CM | POA: Diagnosis not present

## 2022-03-04 NOTE — Progress Notes (Unsigned)
     03/04/2022 Melissa Nicholson [id number] 02-14-1991   Chief Complaint:  History of Present Illness: Melissa Nicholson is a 31 year old female with a past medical history of anxiety, Covid 19 infection and an fissures s/p chemical sphincterotomy with injection of perianal Botox on 06/21/2020 subsequently diagnosed with  Ileocolonic Crohn's disease with perianal fistula per image studies and colonoscopy 08/2020. She was started on Humira 12/06/2020.  02/04/2022 Patient called in stating that on 01/31/22-02/01/22 she was experiencing bloody, loose stools & abdominal cramping. Since then she has not experienced any further bleeding, however yesterday had diarrhea 4 times during the day. She has also noticed she is having some joint discomfort in her hips, and is wondering if this could be a flare up. She is currently taking Humira & Lialda as prescribed. Patient is scheduled to see Dr. Carlos Levering with IBD center on 05/01/22. Will send to MD for further recommendations  Current Medications, Allergies, Past Medical History, Past Surgical History, Family History and Social History were reviewed in [employer] record.   Review of Systems:   Constitutional: Negative for fever, sweats, chills or weight loss.  Respiratory: Negative for shortness of breath.   Cardiovascular: Negative for chest pain, palpitations and leg swelling.  Gastrointestinal: See HPI.  Musculoskeletal: Negative for back pain or muscle aches.  Neurological: Negative for dizziness, headaches or paresthesias.    Physical Exam: There were no vitals taken for this visit. General: Well developed, w   ***female in no acute distress. Head: Normocephalic and atraumatic. Eyes: No scleral icterus. Conjunctiva pink . Ears: Normal auditory acuity. Mouth: Dentition intact. No ulcers or lesions.  Lungs: Clear throughout to auscultation. Heart: Regular rate and rhythm, no murmur. Abdomen: Soft, nontender and nondistended. No  masses or hepatomegaly. Normal bowel sounds x 4 quadrants.  Rectal: *** Musculoskeletal: Symmetrical with no gross deformities. Extremities: No edema. Neurological: Alert oriented x 4. No focal deficits.  Psychological: Alert and cooperative. Normal mood and affect  Assessment and Recommendations: ***

## 2022-03-05 ENCOUNTER — Encounter: Payer: Self-pay | Admitting: Nurse Practitioner

## 2022-03-05 ENCOUNTER — Ambulatory Visit: Payer: BC Managed Care – PPO | Admitting: Nurse Practitioner

## 2022-03-05 VITALS — BP 110/64 | HR 89 | Ht 64.0 in | Wt 138.0 lb

## 2022-03-05 DIAGNOSIS — K50119 Crohn's disease of large intestine with unspecified complications: Secondary | ICD-10-CM | POA: Diagnosis not present

## 2022-03-05 NOTE — Patient Instructions (Addendum)
Continue Humira as previously prescribed   Contact our office if you develop frequent loose stools, rectal bleeding, worsening fistula drainage or significant anorectal pain  The [place] [facility] providers would like to encourage you to use [facility] to communicate with providers for non-urgent requests or questions.  Due to long hold times on the telephone, sending your provider a message by [facility] may be a faster and more efficient way to get a response.  Please allow 48 business hours for a response.  Please remember that this is for non-urgent requests.

## 2022-03-12 ENCOUNTER — Ambulatory Visit: Payer: BC Managed Care – PPO | Admitting: Dermatology

## 2022-03-12 VITALS — Wt 138.0 lb

## 2022-03-12 DIAGNOSIS — L853 Xerosis cutis: Secondary | ICD-10-CM

## 2022-03-12 DIAGNOSIS — Z79899 Other long term (current) drug therapy: Secondary | ICD-10-CM

## 2022-03-12 DIAGNOSIS — L700 Acne vulgaris: Secondary | ICD-10-CM

## 2022-03-12 DIAGNOSIS — K130 Diseases of lips: Secondary | ICD-10-CM | POA: Diagnosis not present

## 2022-03-12 DIAGNOSIS — L309 Dermatitis, unspecified: Secondary | ICD-10-CM

## 2022-03-12 MED ORDER — ISOTRETINOIN 40 MG PO CAPS: 40.0000 mg | ORAL_CAPSULE | Freq: Every day | ORAL | 0 refills | Status: DC

## 2022-03-12 MED ORDER — MUPIROCIN 2 % EX OINT: 1.0000 | TOPICAL_OINTMENT | Freq: Two times a day (BID) | CUTANEOUS | 0 refills | Status: DC

## 2022-03-12 MED ORDER — KETOCONAZOLE 2 % EX CREA: 1.0000 | TOPICAL_CREAM | Freq: Two times a day (BID) | CUTANEOUS | 0 refills | Status: AC

## 2022-03-12 NOTE — Patient Instructions (Addendum)
Can start over the counter fish oil capsule 1 gram / 1000 mg by mouth daily Start Allegra tab by mouth daily   For Dry skin at hands  Can apply Elidil cream to hands in evening followed by over the counter Aquaphor.   For Chapped Lips  Continue Elidel ointment to affected area  Start Ketoconzole cream - apply twice daily to affected areas until clear  Start mupirocin ointment - apply twice daily to affected areas of lips daily until clear    While taking Isotretinoin and for 30 days after you finish the medication, do not get pregnant, do not share pills, do not donate blood.  Generic isotretinoin is best absorbed when taken with a fatty meal. Isotretinoin can make you sensitive to the sun. Daily careful sun protection including sunscreen SPF 30+ when outdoors is recommended.   Due to recent changes in healthcare laws, you may see results of your pathology and/or laboratory studies on MyChart before the doctors have had a chance to review them. We understand that in some cases there may be results that are confusing or concerning to you. Please understand that not all results are received at the same time and often the doctors may need to interpret multiple results in order to provide you with the best plan of care or course of treatment. Therefore, we ask that you please give [place] 2 business days to thoroughly review all your results before contacting the office for clarification. Should we see a critical lab result, you will be contacted sooner.   If You Need Anything After Your Visit  If you have any questions or concerns for your doctor, please call our main line at [phone] and press option 4 to reach your doctor's medical assistant. If no one answers, please leave a voicemail as directed and we will return your call as soon as possible. Messages left after 4 pm will be answered the following business day.   You may also send [place] a message via [place]. We typically respond to  MyChart messages within 1-2 business days.  For prescription refills, please ask your pharmacy to contact our office. Our fax number is [phone].  If you have an urgent issue when the clinic is closed that cannot wait until the next business day, you can page your doctor at the number below.    Please note that while we do our best to be available for urgent issues outside of office hours, we are not available 24/7.   If you have an urgent issue and are unable to reach [place], you may choose to seek medical care at your doctor's office, retail clinic, urgent care center, or emergency room.  If you have a medical emergency, please immediately call 911 or go to the emergency department.  Pager Numbers  - Dr. Nehemiah Massed: [phone]  - Dr. Laurence Ferrari: [phone]  - Dr. Nicole Kindred: [phone]  In the event of inclement weather, please call our main line at [phone] for an update on the status of any delays or closures.  Dermatology Medication Tips: Please keep the boxes that topical medications come in in order to help keep track of the instructions about where and how to use these. Pharmacies typically print the medication instructions only on the boxes and not directly on the medication tubes.   If your medication is too expensive, please contact our office at [phone] option 4 or send [place] a message through [place].   We are unable to tell what your co-pay for  medications will be in advance as this is different depending on your insurance coverage. However, we may be able to find a substitute medication at lower cost or fill out paperwork to get insurance to cover a needed medication.   If a prior authorization is required to get your medication covered by your insurance company, please allow [place] 1-2 business days to complete this process.  Drug prices often vary depending on where the prescription is filled and some pharmacies may offer cheaper prices.  The website www.goodrx.com  contains coupons for medications through different pharmacies. The prices here do not account for what the cost may be with help from insurance (it may be cheaper with your insurance), but the website can give you the price if you did not use any insurance.  - You can print the associated coupon and take it with your prescription to the pharmacy.  - You may also stop by our office during regular business hours and pick up a GoodRx coupon card.  - If you need your prescription sent electronically to a different pharmacy, notify our office through [facility] or by phone at [phone] option 4.     Si Usted Necesita Algo Despus de Su Visita  Tambin puede enviarnos un mensaje a travs de Pharmacist, community. Por lo general respondemos a los mensajes de MyChart en el transcurso de 1 a 2 das hbiles.  Para renovar recetas, por favor pida a su farmacia que se ponga en contacto con nuestra oficina. Harland Dingwall de fax es [place] [phone].  Si tiene un asunto urgente cuando la clnica est cerrada y que no puede esperar hasta el siguiente da hbil, puede llamar/localizar a su doctor(a) al nmero que aparece a continuacin.   Por favor, tenga en cuenta que aunque hacemos todo lo posible para estar disponibles para asuntos urgentes fuera del horario de [place], no estamos disponibles las 24 horas del da, los 7 das de la [place].   Si tiene un problema urgente y no puede comunicarse con nosotros, puede optar por buscar atencin mdica  en el consultorio de su doctor(a), en una clnica privada, en un centro de atencin urgente o en una sala de emergencias.  Si tiene Engineering geologist, por favor llame inmediatamente al 911 o vaya a la sala de emergencias.  Nmeros de bper  - Dr. Nehemiah Massed: [phone]  - Dra. Moye: [phone]  - Dra. Nicole Kindred: [phone]  En caso de inclemencias del [place], por favor llame a Johnsie Kindred principal al [phone] para una actualizacin sobre el [place]  de cualquier retraso o cierre.  Consejos para la medicacin en dermatologa: Por favor, guarde las cajas en las que vienen los medicamentos de uso tpico para ayudarle a seguir las instrucciones sobre dnde y cmo usarlos. Las farmacias generalmente imprimen las instrucciones del medicamento slo en las cajas y no directamente en los tubos del [place].   Si su medicamento es muy caro, por favor, pngase en contacto con Zigmund Daniel llamando al [phone] y presione la opcin 4 o envenos un mensaje a travs de Pharmacist, community.   No podemos decirle cul ser su copago por los medicamentos por adelantado ya que esto es diferente dependiendo de la cobertura de su seguro. Sin embargo, es posible que podamos encontrar un medicamento sustituto a Electrical engineer un formulario para que el seguro cubra el medicamento que se considera necesario.   Si se requiere una autorizacin previa para que su compaa de seguros [place] su medicamento, por favor permtanos  de 1 a 2 das hbiles para completar este proceso.  Los precios de los medicamentos varan con frecuencia dependiendo del Environmental consultant de dnde se surte la receta y alguna farmacias pueden ofrecer precios ms baratos.  El sitio web www.goodrx.com tiene cupones para medicamentos de Airline pilot. Los precios aqu no tienen en cuenta lo que podra costar con la ayuda del seguro (puede ser ms barato con su seguro), pero el sitio web puede darle el precio si no utiliz Research scientist (physical sciences).  - Puede imprimir el cupn correspondiente y llevarlo con su receta a la farmacia.  - Tambin puede pasar por nuestra oficina durante el horario de atencin regular y Charity fundraiser una tarjeta de cupones de GoodRx.  - Si necesita que su receta se enve electrnicamente a una farmacia diferente, informe a nuestra oficina a travs de MyChart de [place] o por telfono llamando al [phone] y presione la opcin 4.

## 2022-03-12 NOTE — Progress Notes (Signed)
Isotretinoin Follow-Up Visit   Subjective  Melissa Nicholson is a 31 y.o. female who presents for the following: acne vulgaris (Patient reports 1 breakout since last visit. Currently on 40 mg of Claravis. Week 24. Still chapped lips, dry lips, and dry skin. ).  Week # 24   Isotretinoin F/U - 03/12/22 0800       Isotretinoin Follow Up   iPledge # [id number]    Date 03/12/22    Weight 138 lb (62.6 kg)    Two Forms of Birth Control Oral Contraceptives (w/ estrogen)    Acne breakouts since last visit? Yes      Dosage   Target Dosage (mg) 7363    Current (To Date) Dosage (mg) 4700    To Go Dosage (mg) 2663      Side Effects   Skin Chapped Lips;Dry Skin;Dry Nose;Sunburn    Gastrointestinal WNL    Neurological Headache    Constitutional WNL             Side effects: Dry skin, dry lips  Denies changes in night vision, shortness of breath, abdominal pain, nausea, vomiting, diarrhea, blood in stool or urine, visual changes, headaches, epistaxis, joint pain, myalgias, mood changes, depression, or suicidal ideation.   Patient is not pregnant, not seeking pregnancy, and not breastfeeding.   The following portions of the chart were reviewed this encounter and updated as appropriate: medications, allergies, medical history  Review of Systems:  No other skin or systemic complaints except as noted in HPI or Assessment and Plan.  Objective  Well appearing patient in no apparent distress; mood and affect are within normal limits.  An examination of the face, neck, chest, and back was performed and relevant findings are noted below.   face , chest, back Few resolving inflammatory papules trace open comedones at chest and back  b/l corners of mouth Erythematous scaly patches with fissure at angle of mouth  b/l hands Scaly pink plaques    Assessment & Plan   Acne vulgaris face , chest, back  Acne is severe and chronic (present >1 year); patient is currently on  Isotretinoin, requiring FDA mandated monthly evaluations and laboratory monitoring, and not to goal (must reach target dose based on weight and also have clear skin for 2 months prior to discontinuation in order to help prevent relapse)    Continue  Claravis 40 mg 1 capsule once daily with fatty meal.  Start fish oil 1 gram daily Start allegra daily   Check lipid panel and hepatic panel today as she is on higher dose than previous and it has been a few months.  Family H/O hyperlipidemia.    Week # [id number] iPledge: [id number] Pharmacy:  [facility]. [place] Current Total Dosage: 4700 mg Total mg/kg: 75.08 mg/kg Forms of BC:  Oral Contraceptives and Female Condoms  Urine pregnancy test performed in office today and was negative.  Patient demonstrates comprehension and confirms she will not get pregnant.   Patient confirmed in iPledge and isotretinoin sent to pharmacy.   Lipid Panel - face , chest, back  Hepatic Function Panel - face , chest, back  Related Medications ISOtretinoin (CLARAVIS) 40 MG capsule Take 1 capsule (40 mg total) by mouth daily. Take with fatty meal  Cheilitis b/l corners of mouth  Continue pimecrolimus 1 % cream - apply twice daily  Start Ketoconazole 2 % cream - apply bid to aa until clear  Start Mupirocin 2 % ointment - apply bid to  aa until clear.    Can use fresh sugar lip polish as needed to help remove scale   ketoconazole (NIZORAL) 2 % cream - b/l corners of mouth Apply 1 Application topically 2 (two) times daily. To lips apply until healed.  mupirocin ointment (BACTROBAN) 2 % - b/l corners of mouth Apply 1 Application topically 2 (two) times daily. For lips until healed.  Related Medications pimecrolimus (ELIDEL) 1 % cream Apply topically 2 (two) times daily.  Hand dermatitis b/l hands  Start elidel apply to hands twice a day. In evening can follow with a coat of aquaphor.  Gentle skin care reviewed.  If not resolved  recommend contacting us     Xerosis secondary to isotretinoin therapy - Continue emollients as directed  Cheilitis secondary to isotretinoin therapy - Continue lip balm as directed, Dr. Luvenia Heller Cortibalm recommended Also using pimecrolimus   Xerosis - diffuse xerotic patches - recommend gentle, hydrating skin care - gentle skin care handout given  Long term medication management (isotretinoin) - While taking Isotretinoin and for 30 days after you finish the medication, do not get pregnant, do not share pills, do not donate blood. Isotretinoin is best absorbed when taken with a fatty meal. Isotretinoin can make you sensitive to the sun. Daily careful sun protection including sunscreen SPF 30+ when outdoors is recommended.  Follow-up in 30 days.  I, Ruthell Rummage, CMA, am acting as scribe for Forest Gleason, MD.  Documentation: I have reviewed the above documentation for accuracy and completeness, and I agree with the above.  Forest Gleason, MD

## 2022-03-13 ENCOUNTER — Telehealth: Payer: Self-pay

## 2022-03-13 ENCOUNTER — Encounter: Payer: Self-pay | Admitting: Dermatology

## 2022-03-13 LAB — HEPATIC FUNCTION PANEL
ALT: 11 IU/L (ref 0–32)
AST: 16 IU/L (ref 0–40)
Albumin: 4.5 g/dL (ref 3.9–4.9)
Alkaline Phosphatase: 53 IU/L (ref 44–121)
Bilirubin Total: 0.4 mg/dL (ref 0.0–1.2)
Bilirubin, Direct: <0.1 mg/dL (ref 0.00–0.40)
Total Protein: 7.5 g/dL (ref 6.0–8.5)

## 2022-03-13 LAB — LIPID PANEL
Chol/HDL Ratio: 4.8 ratio — ABNORMAL HIGH (ref 0.0–4.4)
Cholesterol, Total: 215 mg/dL — ABNORMAL HIGH (ref 100–199)
HDL: 45 mg/dL (ref >39)
LDL Chol Calc (NIH): 133 mg/dL — ABNORMAL HIGH (ref 0–99)
Triglycerides: 205 mg/dL — ABNORMAL HIGH (ref 0–149)
VLDL Cholesterol Cal: 37 mg/dL (ref 5–40)

## 2022-03-13 NOTE — Telephone Encounter (Signed)
Patient advised of labs.  RX has been sent in and patient confirmed. Ipledge status shows patient qualified to receive drug.  Pt states she is picking up medication this afternoon. aw

## 2022-03-13 NOTE — Telephone Encounter (Signed)
-----   Message from [place], MD sent at 03/13/2022  8:25 AM EDT ----- Liver testing is normal. Cholesterol is slightly elevated but nothing I would worry about while on accutane.  MAs please call. Thank you!

## 2022-03-18 NOTE — Progress Notes (Signed)
Reviewed.  Alban Marucci L. Lita Flynn, MD, MPH  

## 2022-04-01 DIAGNOSIS — H10021 Other mucopurulent conjunctivitis, right eye: Secondary | ICD-10-CM | POA: Diagnosis not present

## 2022-04-16 ENCOUNTER — Encounter: Payer: Self-pay | Admitting: Dermatology

## 2022-04-16 ENCOUNTER — Ambulatory Visit: Payer: BC Managed Care – PPO | Admitting: Dermatology

## 2022-04-16 VITALS — Wt 138.0 lb

## 2022-04-16 DIAGNOSIS — Z79899 Other long term (current) drug therapy: Secondary | ICD-10-CM | POA: Diagnosis not present

## 2022-04-16 DIAGNOSIS — L700 Acne vulgaris: Secondary | ICD-10-CM | POA: Diagnosis not present

## 2022-04-16 DIAGNOSIS — K130 Diseases of lips: Secondary | ICD-10-CM

## 2022-04-16 DIAGNOSIS — L309 Dermatitis, unspecified: Secondary | ICD-10-CM

## 2022-04-16 DIAGNOSIS — L853 Xerosis cutis: Secondary | ICD-10-CM

## 2022-04-16 MED ORDER — CLOBETASOL PROPIONATE 0.05 % EX CREA: 1.0000 | TOPICAL_CREAM | Freq: Two times a day (BID) | CUTANEOUS | 0 refills | Status: DC

## 2022-04-16 MED ORDER — ISOTRETINOIN 30 MG PO CAPS: 30.0000 mg | ORAL_CAPSULE | Freq: Every day | ORAL | 0 refills | Status: DC

## 2022-04-16 NOTE — Patient Instructions (Signed)
Due to recent changes in healthcare laws, you may see results of your pathology and/or laboratory studies on MyChart before the doctors have had a chance to review them. We understand that in some cases there may be results that are confusing or concerning to you. Please understand that not all results are received at the same time and often the doctors may need to interpret multiple results in order to provide you with the best plan of care or course of treatment. Therefore, we ask that you please give [place] 2 business days to thoroughly review all your results before contacting the office for clarification. Should we see a critical lab result, you will be contacted sooner.   If You Need Anything After Your Visit  If you have any questions or concerns for your doctor, please call our main line at [phone] and press option 4 to reach your doctor's medical assistant. If no one answers, please leave a voicemail as directed and we will return your call as soon as possible. Messages left after 4 pm will be answered the following business day.   You may also send [place] a message via [place]. We typically respond to MyChart messages within 1-2 business days.  For prescription refills, please ask your pharmacy to contact our office. Our fax number is [phone].  If you have an urgent issue when the clinic is closed that cannot wait until the next business day, you can page your doctor at the number below.    Please note that while we do our best to be available for urgent issues outside of office hours, we are not available 24/7.   If you have an urgent issue and are unable to reach [place], you may choose to seek medical care at your doctor's office, retail clinic, urgent care center, or emergency room.  If you have a medical emergency, please immediately call 911 or go to the emergency department.  Pager Numbers  - Dr. Nehemiah Massed: [phone]  - Dr. Laurence Ferrari: [phone]  - Dr. Nicole Kindred:  [phone]  In the event of inclement weather, please call our main line at [phone] for an update on the status of any delays or closures.  Dermatology Medication Tips: Please keep the boxes that topical medications come in in order to help keep track of the instructions about where and how to use these. Pharmacies typically print the medication instructions only on the boxes and not directly on the medication tubes.   If your medication is too expensive, please contact our office at [phone] option 4 or send [place] a message through [place].   We are unable to tell what your co-pay for medications will be in advance as this is different depending on your insurance coverage. However, we may be able to find a substitute medication at lower cost or fill out paperwork to get insurance to cover a needed medication.   If a prior authorization is required to get your medication covered by your insurance company, please allow [place] 1-2 business days to complete this process.  Drug prices often vary depending on where the prescription is filled and some pharmacies may offer cheaper prices.  The website www.goodrx.com contains coupons for medications through different pharmacies. The prices here do not account for what the cost may be with help from insurance (it may be cheaper with your insurance), but the website can give you the price if you did not use any insurance.  - You can print the associated coupon and take it with  your prescription to the pharmacy.  - You may also stop by our office during regular business hours and pick up a GoodRx coupon card.  - If you need your prescription sent electronically to a different pharmacy, notify our office through [facility] or by phone at [phone] option 4.     Si Usted Necesita Algo Despus de Su Visita  Tambin puede enviarnos un mensaje a travs de Pharmacist, community. Por lo general respondemos a los mensajes de MyChart en el transcurso de 1 a 2  das hbiles.  Para renovar recetas, por favor pida a su farmacia que se ponga en contacto con nuestra oficina. Harland Dingwall de fax es [place] [phone].  Si tiene un asunto urgente cuando la clnica est cerrada y que no puede esperar hasta el siguiente da hbil, puede llamar/localizar a su doctor(a) al nmero que aparece a continuacin.   Por favor, tenga en cuenta que aunque hacemos todo lo posible para estar disponibles para asuntos urgentes fuera del horario de [place], no estamos disponibles las 24 horas del da, los 7 das de la [place].   Si tiene un problema urgente y no puede comunicarse con nosotros, puede optar por buscar atencin mdica  en el consultorio de su doctor(a), en una clnica privada, en un centro de atencin urgente o en una sala de emergencias.  Si tiene Engineering geologist, por favor llame inmediatamente al 911 o vaya a la sala de emergencias.  Nmeros de bper  - Dr. Nehemiah Massed: [phone]  - Dra. Moye: [phone]  - Dra. Nicole Kindred: [phone]  En caso de inclemencias del [place], por favor llame a Johnsie Kindred principal al [phone] para una actualizacin sobre el [place] de cualquier retraso o cierre.  Consejos para la medicacin en dermatologa: Por favor, guarde las cajas en las que vienen los medicamentos de uso tpico para ayudarle a seguir las instrucciones sobre dnde y cmo usarlos. Las farmacias generalmente imprimen las instrucciones del medicamento slo en las cajas y no directamente en los tubos del [place].   Si su medicamento es muy caro, por favor, pngase en contacto con Zigmund Daniel llamando al [phone] y presione la opcin 4 o envenos un mensaje a travs de Pharmacist, community.   No podemos decirle cul ser su copago por los medicamentos por adelantado ya que esto es diferente dependiendo de la cobertura de su seguro. Sin embargo, es posible que podamos encontrar un medicamento sustituto a Electrical engineer un formulario para que el  seguro cubra el medicamento que se considera necesario.   Si se requiere una autorizacin previa para que su compaa de seguros [place] su medicamento, por favor permtanos de 1 a 2 das hbiles para completar este proceso.  Los precios de los medicamentos varan con frecuencia dependiendo del Environmental consultant de dnde se surte la receta y alguna farmacias pueden ofrecer precios ms baratos.  El sitio web www.goodrx.com tiene cupones para medicamentos de Airline pilot. Los precios aqu no tienen en cuenta lo que podra costar con la ayuda del seguro (puede ser ms barato con su seguro), pero el sitio web puede darle el precio si no utiliz Research scientist (physical sciences).  - Puede imprimir el cupn correspondiente y llevarlo con su receta a la farmacia.  - Tambin puede pasar por nuestra oficina durante el horario de atencin regular y Charity fundraiser una tarjeta de cupones de GoodRx.  - Si necesita que su receta se enve electrnicamente a Chiropodist, informe a nuestra oficina a travs [place]  o por telfono llamando al [phone] y presione la opcin 4.

## 2022-04-16 NOTE — Progress Notes (Signed)
Isotretinoin Follow-Up Visit   Subjective  Melissa Nicholson is a 31 y.o. female who presents for the following: Acne (Patient here today for 1 month isotretinoin follow up. Patient is taking fish oil. Also took Zyrtec for about 1 week but did not notice a difference. C/o conjunctivitis for the 3rd time since being on accutane.).  Week # 28 Pharmacy:  [facility]. [place] Current Total Dosage: 5900 mg Total mg/kg: 94.25 mg/kg Forms of BC:  Oral Contraceptives and Female Condoms   Isotretinoin F/U - 04/16/22 0800       Isotretinoin Follow Up   iPledge # [id number]    Date 04/16/22    Weight 138 lb (62.6 kg)    Two Forms of Birth Control Oral Contraceptives (w/ estrogen);Female Condom    Acne breakouts since last visit? Yes      Dosage   Target Dosage (mg) 7363    Current (To Date) Dosage (mg) 5900    To Go Dosage (mg) 1463      Side Effects   Skin Chapped Lips;Dry Skin    Gastrointestinal WNL    Neurological WNL    Constitutional WNL    Other Side Effects conjunctivitus 3 times since being on accutane             Side effects: Dry skin, dry lips  Denies changes in night vision, shortness of breath, abdominal pain, nausea, vomiting, diarrhea, blood in stool or urine, visual changes, headaches, epistaxis, joint pain, myalgias, mood changes, depression, or suicidal ideation.   Patient is not pregnant, not seeking pregnancy, and not breastfeeding.   The following portions of the chart were reviewed this encounter and updated as appropriate: medications, allergies, medical history  Review of Systems:  No other skin or systemic complaints except as noted in HPI or Assessment and Plan.  Objective  Well appearing patient in no apparent distress; mood and affect are within normal limits.  An examination of the face, neck, chest, and back was performed and relevant findings are noted below.   face Rare resolving inflammatory papule, xerosis, rare open  comedone at face  hands Scaly pink plaques at hands, cheeks    Assessment & Plan   Acne vulgaris face  Acne is severe and chronic (present >1 year); patient is currently on Isotretinoin, requiring FDA mandated monthly evaluations and laboratory monitoring, and not to goal (must reach target dose based on weight and also have clear skin for 2 months prior to discontinuation in order to help prevent relapse)    Continue isotretinoin decreasing to 50m  Recommend rewetting eye drops a few times daily for dryness at eyes.   Urine pregnancy test performed in office today and was negative.  Patient demonstrates comprehension and confirms she will not get pregnant.   Patient confirmed in iPledge and isotretinoin sent to pharmacy.   ISOtretinoin (ACCUTANE) 30 MG capsule - face Take 1 capsule (30 mg total) by mouth daily.  Eczema, unspecified type hands  Start clobetasol 0.05% cream 1-2 times daily for up to 2 weeks followed by Aquaphor to hands for eczema. Avoid applying to face, groin, and axilla. Use as directed. Long-term use can cause thinning of the skin.  Continue fish oil daily Recommend Xyzal daily  Topical steroids (such as triamcinolone, fluocinolone, fluocinonide, mometasone, clobetasol, halobetasol, betamethasone, hydrocortisone) can cause thinning and lightening of the skin if they are used for too long in the same area. Your physician has selected the right strength medicine for your  problem and area affected on the body. Please use your medication only as directed by your physician to prevent side effects.   For face, use elidel twice a day and opzelura (sample given) twice a day. Can follow with aquaphor.   clobetasol cream (TEMOVATE) 0.05 % - hands Apply 1 Application topically 2 (two) times daily. Avoid applying to face, groin, and axilla. Use as directed. Long-term use can cause thinning of the skin.    Xerosis secondary to isotretinoin therapy - Continue  emollients as directed - Xyzal (levocetirizine) once a day and fish oil 1 gram daily may also help with dryness  Cheilitis secondary to isotretinoin therapy - Continue lip balm as directed, Dr. Luvenia Heller Cortibalm recommended  Long term medication management (isotretinoin) - While taking Isotretinoin and for 30 days after you finish the medication, do not get pregnant, do not share pills, do not donate blood. Isotretinoin is best absorbed when taken with a fatty meal. Isotretinoin can make you sensitive to the sun. Daily careful sun protection including sunscreen SPF 30+ when outdoors is recommended.  Follow-up in 30 days.  Graciella Belton, RMA, am acting as scribe for Forest Gleason, MD .  Documentation: I have reviewed the above documentation for accuracy and completeness, and I agree with the above.  Forest Gleason, MD

## 2022-04-21 ENCOUNTER — Encounter (HOSPITAL_BASED_OUTPATIENT_CLINIC_OR_DEPARTMENT_OTHER): Payer: Self-pay | Admitting: Emergency Medicine

## 2022-04-21 ENCOUNTER — Other Ambulatory Visit: Payer: Self-pay

## 2022-04-21 ENCOUNTER — Emergency Department (HOSPITAL_BASED_OUTPATIENT_CLINIC_OR_DEPARTMENT_OTHER)
Admission: EM | Admit: 2022-04-21 | Discharge: 2022-04-21 | Discharge status: Against Medical Advice | Payer: BC Managed Care – PPO | Attending: Emergency Medicine | Admitting: Emergency Medicine

## 2022-04-21 DIAGNOSIS — Z5321 Procedure and treatment not carried out due to patient leaving prior to being seen by health care provider: Secondary | ICD-10-CM | POA: Insufficient documentation

## 2022-04-21 DIAGNOSIS — H5789 Other specified disorders of eye and adnexa: Secondary | ICD-10-CM | POA: Diagnosis not present

## 2022-04-21 NOTE — ED Triage Notes (Signed)
Pt c/o right eye watering and itching. Pt states that her contacts were in when her eye started to itch.

## 2022-04-22 DIAGNOSIS — H16223 Keratoconjunctivitis sicca, not specified as Sjogren's, bilateral: Secondary | ICD-10-CM | POA: Diagnosis not present

## 2022-05-01 DIAGNOSIS — K603 Anal fistula: Secondary | ICD-10-CM | POA: Diagnosis not present

## 2022-05-01 DIAGNOSIS — K50919 Crohn's disease, unspecified, with unspecified complications: Secondary | ICD-10-CM | POA: Diagnosis not present

## 2022-05-01 DIAGNOSIS — K50113 Crohn's disease of large intestine with fistula: Secondary | ICD-10-CM | POA: Diagnosis not present

## 2022-05-09 ENCOUNTER — Ambulatory Visit: Payer: BC Managed Care – PPO | Admitting: Gastroenterology

## 2022-05-09 ENCOUNTER — Encounter: Payer: Self-pay | Admitting: Gastroenterology

## 2022-05-09 ENCOUNTER — Other Ambulatory Visit (INDEPENDENT_AMBULATORY_CARE_PROVIDER_SITE_OTHER): Payer: BC Managed Care – PPO

## 2022-05-09 VITALS — BP 112/70 | HR 71 | Ht 64.0 in | Wt 139.4 lb

## 2022-05-09 DIAGNOSIS — K50119 Crohn's disease of large intestine with unspecified complications: Secondary | ICD-10-CM

## 2022-05-09 LAB — VITAMIN D 25 HYDROXY (VIT D DEFICIENCY, FRACTURES): VITD: 30.61 ng/mL (ref 30.00–100.00)

## 2022-05-09 LAB — IBC + FERRITIN
Ferritin: 34.1 ng/mL (ref 10.0–291.0)
Iron: 90 ug/dL (ref 42–145)
Saturation Ratios: 22.2 % (ref 20.0–50.0)
TIBC: 404.6 ug/dL (ref 250.0–450.0)
Transferrin: 289 mg/dL (ref 212.0–360.0)

## 2022-05-09 LAB — CBC WITH DIFFERENTIAL/PLATELET
Basophils Absolute: 0 10*3/uL (ref 0.0–0.1)
Basophils Relative: 0.6 % (ref 0.0–3.0)
Eosinophils Absolute: 0.1 10*3/uL (ref 0.0–0.7)
Eosinophils Relative: 1.3 % (ref 0.0–5.0)
HCT: 40.2 % (ref 36.0–46.0)
Hemoglobin: 13.4 g/dL (ref 12.0–15.0)
Lymphocytes Relative: 44.2 % (ref 12.0–46.0)
Lymphs Abs: 3.4 10*3/uL (ref 0.7–4.0)
MCHC: 33.4 g/dL (ref 30.0–36.0)
MCV: 90.7 fl (ref 78.0–100.0)
Monocytes Absolute: 0.5 10*3/uL (ref 0.1–1.0)
Monocytes Relative: 6.8 % (ref 3.0–12.0)
Neutro Abs: 3.6 10*3/uL (ref 1.4–7.7)
Neutrophils Relative %: 47.1 % (ref 43.0–77.0)
Platelets: 218 10*3/uL (ref 150.0–400.0)
RBC: 4.43 Mil/uL (ref 3.87–5.11)
RDW: 12.4 % (ref 11.5–15.5)
WBC: 7.7 10*3/uL (ref 4.0–10.5)

## 2022-05-09 MED ORDER — NA SULFATE-K SULFATE-MG SULF 17.5-3.13-1.6 GM/177ML PO SOLN: 1.0000 | Freq: Once | ORAL | 0 refills | Status: AC

## 2022-05-09 NOTE — Patient Instructions (Addendum)
Please stop in the lab today  Continue to taper your Lialda  We will continue Humira every 2 weeks  Continue vitamin D and calcium supplements every day  Please have a colonoscopy at your convenience  Talk to your primary care provider about the Shingrix vaccine. Get it on a Friday afternoon.   You have been scheduled for a colonoscopy. Please follow written instructions given to you at your visit today. Please pick up your prep supplies at the pharmacy within the next 1-3 days. If you use inhalers (even only as needed), please bring them with you on the day of your procedure.  COLONOSCOPY TIPS:  To reduce nausea and dehydration, stay well hydrated for 3-4 days prior to the exam.  To prevent skin/hemorrhoid irritation - prior to wiping, put A&Dointment or vaseline on the toilet paper. Keep a towel or pad on the bed.  BEFORE STARTING YOUR PREP, drink  64oz of clear liquids in the morning. This will help to flush the colon and will ensure you are well hydrated!!!!  NOTE - This is in addition to the fluids required for to complete your prep. Use of a flavored hard candy, such as grape Anise Salvo, can counteract some of the flavor of the prep and may prevent some nausea.

## 2022-05-09 NOTE — Progress Notes (Signed)
Referring Provider: Dineen Kid, MD Primary Care Physician:  Dineen Kid, MD  Chief complaint:  Crohn's disease   IMPRESSION:  Ileocolonic Crohn's disease with persistent perianal fistula: Started Humira 12/06/2020.  Tapering mesalamine.  Must consider addition of thiopurine. Return to [facility] IBD clinic PRN.   Colonic Crohn's disease with perianal fistula. Her luminal disease seems well-controlled symptomatically and biochemically on adalimumab every other week that was started 12/06/20. Would recommend continuing adalimumab every other week indefinitely.   Perianal fistula. Likely due to Crohn's disease. Significantly improved on adalimumab. However, improvement in drainage has plateaued. Could consider adding ciprofloxacin and metronidazole to reduce drainage though the effects of these would likely be temporary. Alternatively, could consider adding azathioprine to adalimumab though this may take 3 months to determine if that will help. Yet another option would be to change adalimumab to infliximab combined with azathioprine. Dr. Carlos Levering did not think the potential risks of changing medical therapy for the sole purpose of decreasing fistula drainage outweigh the potential benefits. Colorectal surgeon consultation for fistulotomy recommended. She wishes to defer any additional treatment at this time as she is overall feeling better.    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 Recommend Shingrix shingles vaccine series and Prevnar-20 if not already vaccinated against pneumococcal pneumonia.  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   Anal fissures s/p chemical sphincterotomy with injection of perianal Botox on 06/21/2020  PLAN: - CBC every 3 months - Vitamin D level - Iron, ferritin - Fecal calprotectin to monitor response to therapy - Taper Lialda 4.8 g daily - Continue Humira 40 mg dosed every 2 weeks - Calcium and vitamin D supplementations - Annual dermatology head-to-toe exam  scheduled in December - Follow-up with [facility]/Surgeon if perianal fistula is not improving - Shingrix vaccine - Colonoscopy to evaluate for histology intestinal remission - Follow-up in 3-6 months, earlier if needed   Please see the "Patient Instructions" section for addition details about the plan.  HPI: Melissa Nicholson is a 31 y.o. female who returns in follow-up of Crohn's disease with perianal fistula.  Interval history is obtained through the patient, review of her electronic health record, and review of records from her consultation at the [facility].  Has some increasing drainage from fistula a couple of weeks ago, but, symptoms resolved on their own without intervention. Having one or two formed bowel movement daily. Rare blood occuring 1-2 times/month. Energy is better. Appete is better.  Some tenderness at the fistula.    Brief IBD Disease Course:  Colonic Crohn's disease with perianal fistula. 08/2019, perianal pain and diarrhea. 06/2020, diagnosed with anal fissure and injected with Botox without relief. 08/2020, MRI showed perianal fistula. 09/19/2020, colonoscopy confirmed Crohn's colitis. 09/2020, failed mesalamine and budesonide. 12/06/2020, start adalimumab. Significant symptomatic improvement, but persistent mild perianal drainage.  Fecal calprotectin 673 10/02/20. Fecal calprotectin 06/06/21 19.   Endoscopy:  - Colonoscopy 09/19/20 - inflammation throughout the entire colon except for the distal sigmoid and rectum.  Biopsies of the right colon showed ulcer with moderate active colitis, transverse colon with focal active colitis, left colon and rectum without significant pathologic findings.  The TI was normal.  The pathology report was consistent with IBD. PATH = IBD  Test Data: - 01/12/2017, CT abdomen with contrast normal - 09/10/2020, MRI Pelvis "Small perianal fistula suggested arising from the inferior aspect of the sphincter complex the immediate perianal soft  tissues, extending inferiorly and potentially into the gluteal cleft perhaps slightly more on  the RIGHT than the LEFT. No drainable fluid. Signs of ileitis with ascites and interloop fluid of unclear etiology " - 09/11/2020, CT enterography "Fairly marked wall thickening and enhancement involving the distal and terminal ileum most suggestive of active Crohn's disease. Moderate fluid throughout the colon typically seen with diarrhea. " - 09/19/2020, colonoscopy to TI x 3-4 cm. Deeper intubation precluded by looping. Mild erythema, edema, granularity from sigmoid to cecum. Normal-appearing terminal ileum. Anal fissure and skin tag. Biopsies of right colon showed moderate active colitis. Transverse colon showed focal active colitis. Left colon and rectum biopsies normal. - 10/02/2020, fecal calprotectin 673, TPMT activity 19 (normal). QuantiFERON gold, HBsAg, HBsAb, HCVAb all negative/normal - 02/14/2021, adalimumab 11, antidrug antibody <25 - 02/05/2022, fecal calprotectin 42. GI pathogens and C. difficile negative   IBD health maintenance: Influenza vaccine: Receives annually, last 01/2022 Pneumonia vaccine: Prevnar Hepatitis B: Completed for vaccine TB testing: QuantiFERON 10/02/20 Chickenpox/Shingles history: Chicken pox in preschool Bone denistometry: None Derm appointment: Dermatologist, previously PRN Last small bowel imaging: 08/2020 Last colonoscopy: 08/2020 Not considering family planning for another 2-3 years: She will alert me if she becomes pregnant  Past Medical History:  Diagnosis Date   Acne 2023   isotretinoin   Anxiety    Atypical mole 06/13/2016   lower mid back mild   Chronic anal fissure    COVID 06/2019   sob, chest pain loss of taste and smell fever coughx 1 week all symptoms resolved   COVID-19 11/2020   Crohn's colitis ([place])    Fistula, anal    Wears glasses     Past Surgical History:  Procedure Laterality Date   ANAL FISSURE REPAIR     with botox   COLONOSCOPY      lumps removed from both breasts Bilateral 2011   fibrous adenomas benign   RECTAL EXAM UNDER ANESTHESIA N/A 06/21/2020   Procedure: ANORECTAL EXAM UNDER ANESTHESIA. INJECTION OF ANAL BOTOX;  Surgeon: Ileana Roup, MD;  Location: [facility];  Service: General;  Laterality: N/A;   WISDOM TOOTH EXTRACTION      Current Outpatient Medications  Medication Sig Dispense Refill   citalopram (CELEXA) 20 MG tablet Take 20 mg by mouth daily.     ELDERBERRY PO Take by mouth. daily     HUMIRA PEN 40 MG/0.4ML PNKT INJECT 40 MG (0.4 ML) UNDER THE SKIN EVERY 14 DAYS 2 each 6   ISOtretinoin (ACCUTANE) 30 MG capsule Take 1 capsule (30 mg total) by mouth daily. 30 capsule 0   LO LOESTRIN FE 1 MG-10 MCG / 10 MCG tablet Take 1 tablet by mouth daily.     mesalamine (LIALDA) 1.2 g EC tablet Take 4 tablets (4.8 g total) by mouth daily with breakfast. (Patient taking differently: Take 4.8 g by mouth daily with breakfast. Take 2 tablets daily w/ breakfast) 360 tablet 1   Na Sulfate-K Sulfate-Mg Sulf 17.5-3.13-1.6 GM/177ML SOLN Take 1 kit by mouth once for 1 dose. 354 mL 0   OVER THE COUNTER MEDICATION Probiotic gummy- one daily     pimecrolimus (ELIDEL) 1 % cream Apply topically 2 (two) times daily. 30 g 0   clobetasol cream (TEMOVATE) 7.91 % Apply 1 Application topically 2 (two) times daily. Avoid applying to face, groin, and axilla. Use as directed. Long-term use can cause thinning of the skin. (Patient not taking: Reported on 05/09/2022) 30 g 0   mupirocin ointment (BACTROBAN) 2 % Apply 1 Application topically 2 (two) times daily. For lips  until healed. (Patient not taking: Reported on 05/09/2022) 22 g 0   No current facility-administered medications for this visit.    Allergies as of 05/09/2022 - Review Complete 05/09/2022  Allergen Reaction Noted   Crab (diagnostic)  06/19/2020    Family History  Problem Relation Age of Onset   Heart block Father    Colon cancer Paternal Grandmother     Stomach cancer Neg Hx    Esophageal cancer Neg Hx    Pancreatic cancer Neg Hx    Rectal cancer Neg Hx      Physical Exam: General:   Alert,  well-nourished, pleasant and cooperative in NAD Head:  Normocephalic and atraumatic. Eyes:  Sclera clear, no icterus.   Conjunctiva pink. Abdomen:  Soft,nontender, nondistended, normal bowel sounds, no rebound or guarding. No hepatosplenomegaly.   Rectal:   No chemical dermatitis. Persistent anterior fissure and posterior fissures with overlying yellow exudate, small fistula orifice to the right posterior anal area. No exudate.  Neurologic:  Alert and  oriented x4;  grossly nonfocal Skin:  Intact without significant lesions or rashes. Psych:  Alert and cooperative. Normal mood and affect.     Lenyx Boody L. Tarri Glenn, MD, MPH 05/09/2022, 4:41 PM

## 2022-05-12 ENCOUNTER — Other Ambulatory Visit: Payer: Self-pay

## 2022-05-12 ENCOUNTER — Encounter (HOSPITAL_BASED_OUTPATIENT_CLINIC_OR_DEPARTMENT_OTHER): Payer: Self-pay | Admitting: Emergency Medicine

## 2022-05-12 ENCOUNTER — Emergency Department (HOSPITAL_BASED_OUTPATIENT_CLINIC_OR_DEPARTMENT_OTHER)
Admission: EM | Admit: 2022-05-12 | Discharge: 2022-05-13 | Disposition: A | Discharge status: Home / Self Care | Payer: BC Managed Care – PPO | Attending: Emergency Medicine | Admitting: Emergency Medicine

## 2022-05-12 ENCOUNTER — Emergency Department (HOSPITAL_BASED_OUTPATIENT_CLINIC_OR_DEPARTMENT_OTHER): Payer: BC Managed Care – PPO

## 2022-05-12 DIAGNOSIS — M79662 Pain in left lower leg: Secondary | ICD-10-CM | POA: Insufficient documentation

## 2022-05-12 NOTE — ED Triage Notes (Signed)
Patient c/o tingling in her left calf for approx. 1 week. States tonight it feels like it is throbbing and is concerned for a blood clot.

## 2022-05-13 ENCOUNTER — Encounter (HOSPITAL_BASED_OUTPATIENT_CLINIC_OR_DEPARTMENT_OTHER): Payer: Self-pay | Admitting: Emergency Medicine

## 2022-05-13 DIAGNOSIS — M79605 Pain in left leg: Secondary | ICD-10-CM | POA: Diagnosis not present

## 2022-05-13 MED ORDER — ACETAMINOPHEN 500 MG PO TABS
1000.0000 mg | ORAL_TABLET | Freq: Once | ORAL | Status: AC
  Administered 2022-05-13: 1000 mg via ORAL
  Filled 2022-05-13: qty 2

## 2022-05-13 MED ORDER — KETOROLAC TROMETHAMINE 60 MG/2ML IM SOLN
30.0000 mg | Freq: Once | INTRAMUSCULAR | Status: DC
  Filled 2022-05-13: qty 2

## 2022-05-13 NOTE — ED Provider Notes (Signed)
[facility] EMERGENCY DEPT Provider Note   CSN: [id number] Arrival date & time: 05/12/22  6659     History  Chief Complaint  Patient presents with   Leg Pain    Melissa Nicholson is a 31 y.o. female.  The history is provided by the patient.  Leg Pain Location:  Leg Injury: no   Leg location:  L lower leg Pain details:    Quality: tingling.   Radiates to:  Does not radiate   Severity:  Moderate   Timing:  Constant   Progression:  Unchanged Chronicity:  New Foreign body present:  No foreign bodies Relieved by:  Nothing Worsened by:  Nothing Ineffective treatments:  None tried Associated symptoms: no back pain, no decreased ROM, no fatigue, no muscle weakness, no neck pain, no numbness, no stiffness and no swelling   Risk factors: no concern for non-accidental trauma        Home Medications Prior to Admission medications   Medication Sig Start Date End Date Taking? Authorizing Provider  citalopram (CELEXA) 20 MG tablet Take 20 mg by mouth daily.    [provider]  clobetasol cream (TEMOVATE) 9.35 % Apply 1 Application topically 2 (two) times daily. Avoid applying to face, groin, and axilla. Use as directed. Long-term use can cause thinning of the skin. Patient not taking: Reported on 05/09/2022 04/16/22   Alfonso Patten, MD  ELDERBERRY PO Take by mouth. daily    [provider]  HUMIRA PEN 40 MG/0.4ML PNKT INJECT 40 MG (0.4 ML) UNDER THE SKIN EVERY 14 DAYS 11/18/21   Thornton Park, MD  ISOtretinoin (ACCUTANE) 30 MG capsule Take 1 capsule (30 mg total) by mouth daily. 04/16/22   Moye, [place], MD  LO LOESTRIN FE 1 MG-10 MCG / 10 MCG tablet Take 1 tablet by mouth daily. 08/19/20   [provider]  mesalamine (LIALDA) 1.2 g EC tablet Take 4 tablets (4.8 g total) by mouth daily with breakfast. Patient taking differently: Take 4.8 g by mouth daily with breakfast. Take 2 tablets daily w/ breakfast 09/09/21   Noralyn Pick, NP  mupirocin ointment (BACTROBAN) 2 % Apply 1 Application topically 2 (two) times daily. For lips until healed. Patient not taking: Reported on 05/09/2022 03/12/22   Alfonso Patten, MD  OVER THE COUNTER MEDICATION Probiotic gummy- one daily    [provider]  pimecrolimus (ELIDEL) 1 % cream Apply topically 2 (two) times daily. 01/21/22   Ralene Bathe, MD      Allergies    Crab (diagnostic)    Review of Systems   Review of Systems  Constitutional:  Negative for fatigue.  Musculoskeletal:  Negative for back pain, neck pain and stiffness.    Physical Exam Updated Vital Signs BP 138/86   Pulse 90   Temp 98.4 F (36.9 C)   Resp 18   Ht 5' 4"  (1.626 m)   Wt 62.6 kg   LMP 05/12/2022 (Exact Date)   SpO2 100%   BMI 23.69 kg/m  Physical Exam Vitals and nursing note reviewed.  Constitutional:      General: She is not in acute distress.    Appearance: Normal appearance. She is well-developed.  HENT:     Head: Normocephalic and atraumatic.     Nose: Nose normal.  Eyes:     Pupils: Pupils are equal, round, and reactive to light.  Cardiovascular:     Rate and Rhythm: Normal rate and regular rhythm.     Pulses: Normal  pulses.     Heart sounds: Normal heart sounds.  Pulmonary:     Effort: Pulmonary effort is normal. No respiratory distress.     Breath sounds: Normal breath sounds.  Abdominal:     General: Bowel sounds are normal. There is no distension.     Palpations: Abdomen is soft.     Tenderness: There is no abdominal tenderness. There is no guarding or rebound.  Genitourinary:    Vagina: No vaginal discharge.  Musculoskeletal:        General: Normal range of motion.     Cervical back: Neck supple.     Comments: All compartments of the LLE soft  Skin:    General: Skin is dry.     Capillary Refill: Capillary refill takes less than 2 seconds.     Findings: No erythema or rash.  Neurological:     General: No focal deficit present.     Mental Status: She  is alert.     Deep Tendon Reflexes: Reflexes normal.  Psychiatric:        Mood and Affect: Mood normal.     ED Results / Procedures / Treatments   Labs (all labs ordered are listed, but only abnormal results are displayed) Labs Reviewed  PREGNANCY, URINE    EKG None  Radiology US Venous Img Lower Unilateral Left (DVT)  Result Date: 05/13/2022 CLINICAL DATA:  Left leg pain EXAM: LEFT LOWER EXTREMITY VENOUS DOPPLER ULTRASOUND TECHNIQUE: Gray-scale sonography with graded compression, as well as color Doppler and duplex ultrasound were performed to evaluate the lower extremity deep venous systems from the level of the common femoral vein and including the common femoral, femoral, profunda femoral, popliteal and calf veins including the posterior tibial, peroneal and gastrocnemius veins when visible. The superficial great saphenous vein was also interrogated. Spectral Doppler was utilized to evaluate flow at rest and with distal augmentation maneuvers in the common femoral, femoral and popliteal veins. COMPARISON:  None Available. FINDINGS: Contralateral Common Femoral Vein: Respiratory phasicity is normal and symmetric with the symptomatic side. No evidence of thrombus. Normal compressibility. Common Femoral Vein: No evidence of thrombus. Normal compressibility, respiratory phasicity and response to augmentation. Saphenofemoral Junction: No evidence of thrombus. Normal compressibility and flow on color Doppler imaging. Profunda Femoral Vein: No evidence of thrombus. Normal compressibility and flow on color Doppler imaging. Femoral Vein: No evidence of thrombus. Normal compressibility, respiratory phasicity and response to augmentation. Popliteal Vein: No evidence of thrombus. Normal compressibility, respiratory phasicity and response to augmentation. Calf Veins: No evidence of thrombus. Normal compressibility and flow on color Doppler imaging. Superficial Great Saphenous Vein: No evidence of  thrombus. Normal compressibility. Venous Reflux:  None. Other Findings:  None. IMPRESSION: No evidence of deep venous thrombosis. Electronically Signed   By: Inez Catalina M.D.   On: 05/13/2022 00:11    Procedures Procedures    Medications Ordered in ED Medications  ketorolac (TORADOL) injection 30 mg (has no administration in time range)  acetaminophen (TYLENOL) tablet 1,000 mg (has no administration in time range)    ED Course/ Medical Decision Making/ A&P                           Medical Decision Making Patient with tingly pain in the medial left calf since the weekend.    Amount and/or Complexity of Data Reviewed External Data Reviewed: notes.    Details: Previous notes reviewed  Labs: ordered. Radiology: ordered and independent interpretation performed.  Details: Negative doppler of the LLE by me   Risk OTC drugs. Prescription drug management. Risk Details: Patient with negative [place] for DVT.  Normal strength and sensation.  LLE is neurovascularly intact.  Walking normally.  Intact dorsalis pedis.  Stable for discharge. Strict return    Final Clinical Impression(s) / ED Diagnoses Final diagnoses:  None   Return for intractable cough, coughing up blood, fevers > 100.4 unrelieved by medication, shortness of breath, intractable vomiting, chest pain, shortness of breath, weakness, numbness, changes in speech, facial asymmetry, abdominal pain, passing out, Inability to tolerate liquids or food, cough, altered mental status or any concerns. No signs of systemic illness or infection. The patient is nontoxic-appearing on exam and vital signs are within normal limits.  I have reviewed the triage vital signs and the nursing notes. Pertinent labs & imaging results that were available during my care of the patient were reviewed by me and considered in my medical decision making (see chart for details). After history, exam, and medical workup I feel the patient has been appropriately  medically screened and is safe for discharge home. Pertinent diagnoses were discussed with the patient. Patient was given return precautions.    Rx / DC Orders ED Discharge Orders     None         Damira Kem, MD 05/13/22 0145

## 2022-05-19 ENCOUNTER — Encounter: Payer: Self-pay | Admitting: Dermatology

## 2022-05-19 ENCOUNTER — Ambulatory Visit: Payer: BC Managed Care – PPO | Admitting: Dermatology

## 2022-05-19 VITALS — Wt 138.0 lb

## 2022-05-19 DIAGNOSIS — K130 Diseases of lips: Secondary | ICD-10-CM

## 2022-05-19 DIAGNOSIS — Z79899 Other long term (current) drug therapy: Secondary | ICD-10-CM

## 2022-05-19 DIAGNOSIS — L700 Acne vulgaris: Secondary | ICD-10-CM

## 2022-05-19 DIAGNOSIS — L853 Xerosis cutis: Secondary | ICD-10-CM

## 2022-05-19 MED ORDER — ISOTRETINOIN 30 MG PO CAPS: 30.0000 mg | ORAL_CAPSULE | Freq: Every day | ORAL | 0 refills | Status: DC

## 2022-05-19 NOTE — Progress Notes (Signed)
Isotretinoin Follow-Up Visit   Subjective  Melissa Nicholson is a 31 y.o. female who presents for the following: Acne (Patient here today for 30 day accutane follow up. ).  Patient had some leg pain last week, tested negative for blood clot. She did also have some muscle pain and is scheduled with PCP this week to follow up.   Week # 32 Pharmacy:  [facility]. [place] Current Total Dosage: 6800 mg Total mg/kg: 108.62 mg/kg Forms of BC:  Oral Contraceptives and Female Condoms    Isotretinoin F/U - 05/19/22 0800       Isotretinoin Follow Up   iPledge # [id number]    Date 05/19/22    Weight 138 lb (62.6 kg)    Two Forms of Birth Control Oral Contraceptives (w/ estrogen);Female Condom    Acne breakouts since last visit? Yes   one small break out following last months appt     Dosage   Target Dosage (mg) 7363    Current (To Date) Dosage (mg) 6800    To Go Dosage (mg) 563      Side Effects   Skin Chapped Lips;Dry Skin    Gastrointestinal WNL    Neurological WNL    Constitutional Muscle/joint aches             Side effects: Dry skin, dry lips  Denies changes in night vision, shortness of breath, abdominal pain, nausea, vomiting, diarrhea, blood in stool or urine, visual changes, headaches, epistaxis, joint pain, myalgias, mood changes, depression, or suicidal ideation.   Patient is not pregnant, not seeking pregnancy, and not breastfeeding.   The following portions of the chart were reviewed this encounter and updated as appropriate: medications, allergies, medical history  Review of Systems:  No other skin or systemic complaints except as noted in HPI or Assessment and Plan.  Objective  Well appearing patient in no apparent distress; mood and affect are within normal limits.  An examination of the face, neck, chest, and back was performed and relevant findings are noted below.   face Rare inflammatory papule and open comedone at face Chest and  back clear    Assessment & Plan   Acne vulgaris face  Acne is severe and chronic (present >1 year); patient is currently on Isotretinoin, requiring FDA mandated monthly evaluations and laboratory monitoring, and not to goal (must reach target dose based on weight and also have clear skin for 2 months prior to discontinuation in order to help prevent relapse)    Recommend adding cetirizine or Xyzal to help with dryness. Continue fish oil.   Continue isotretinoin 30 mg once daily. Plan about 3 more months of treatment.  Urine pregnancy test performed in office today and was negative.  Patient demonstrates comprehension and confirms she will not get pregnant.   Patient confirmed in iPledge and isotretinoin sent to pharmacy.   Related Medications ISOtretinoin (ACCUTANE) 30 MG capsule Take 1 capsule (30 mg total) by mouth daily.    Xerosis secondary to isotretinoin therapy - Continue emollients as directed - Xyzal (levocetirizine) once a day and fish oil 1 gram daily may also help with dryness  Cheilitis secondary to isotretinoin therapy - Continue lip balm as directed, Dr. Luvenia Heller Cortibalm recommended  Long term medication management (isotretinoin) - While taking Isotretinoin and for 30 days after you finish the medication, do not get pregnant, do not share pills, do not donate blood. Isotretinoin is best absorbed when taken with a fatty meal. Isotretinoin can  make you sensitive to the sun. Daily careful sun protection including sunscreen SPF 30+ when outdoors is recommended.  Leg Pain - unlikely to be related to isotretinoin. Recommend follow-up with PCP  Follow-up in 30 days.  Graciella Belton, RMA, am acting as scribe for Forest Gleason, MD .  Documentation: I have reviewed the above documentation for accuracy and completeness, and I agree with the above.  Forest Gleason, MD

## 2022-05-19 NOTE — Patient Instructions (Addendum)
Recommend adding cetirizine or Xyzal to help with dryness. Continue fish oil.   Due to recent changes in healthcare laws, you may see results of your pathology and/or laboratory studies on MyChart before the doctors have had a chance to review them. We understand that in some cases there may be results that are confusing or concerning to you. Please understand that not all results are received at the same time and often the doctors may need to interpret multiple results in order to provide you with the best plan of care or course of treatment. Therefore, we ask that you please give [place] 2 business days to thoroughly review all your results before contacting the office for clarification. Should we see a critical lab result, you will be contacted sooner.   If You Need Anything After Your Visit  If you have any questions or concerns for your doctor, please call our main line at [phone] and press option 4 to reach your doctor's medical assistant. If no one answers, please leave a voicemail as directed and we will return your call as soon as possible. Messages left after 4 pm will be answered the following business day.   You may also send [place] a message via [place]. We typically respond to MyChart messages within 1-2 business days.  For prescription refills, please ask your pharmacy to contact our office. Our fax number is [phone].  If you have an urgent issue when the clinic is closed that cannot wait until the next business day, you can page your doctor at the number below.    Please note that while we do our best to be available for urgent issues outside of office hours, we are not available 24/7.   If you have an urgent issue and are unable to reach [place], you may choose to seek medical care at your doctor's office, retail clinic, urgent care center, or emergency room.  If you have a medical emergency, please immediately call 911 or go to the emergency department.  Pager Numbers  - Dr.  Nehemiah Massed: [phone]  - Dr. Laurence Ferrari: [phone]  - Dr. Nicole Kindred: [phone]  In the event of inclement weather, please call our main line at [phone] for an update on the status of any delays or closures.  Dermatology Medication Tips: Please keep the boxes that topical medications come in in order to help keep track of the instructions about where and how to use these. Pharmacies typically print the medication instructions only on the boxes and not directly on the medication tubes.   If your medication is too expensive, please contact our office at [phone] option 4 or send [place] a message through [place].   We are unable to tell what your co-pay for medications will be in advance as this is different depending on your insurance coverage. However, we may be able to find a substitute medication at lower cost or fill out paperwork to get insurance to cover a needed medication.   If a prior authorization is required to get your medication covered by your insurance company, please allow [place] 1-2 business days to complete this process.  Drug prices often vary depending on where the prescription is filled and some pharmacies may offer cheaper prices.  The website www.goodrx.com contains coupons for medications through different pharmacies. The prices here do not account for what the cost may be with help from insurance (it may be cheaper with your insurance), but the website can give you the price if you did not use  any insurance.  - You can print the associated coupon and take it with your prescription to the pharmacy.  - You may also stop by our office during regular business hours and pick up a GoodRx coupon card.  - If you need your prescription sent electronically to a different pharmacy, notify our office through [facility] or by phone at [phone] option 4.     Si Usted Necesita Algo Despus de Su Visita  Tambin puede enviarnos un mensaje a travs de Pharmacist, community. Por lo  general respondemos a los mensajes de MyChart en el transcurso de 1 a 2 das hbiles.  Para renovar recetas, por favor pida a su farmacia que se ponga en contacto con nuestra oficina. Harland Dingwall de fax es [place] [phone].  Si tiene un asunto urgente cuando la clnica est cerrada y que no puede esperar hasta el siguiente da hbil, puede llamar/localizar a su doctor(a) al nmero que aparece a continuacin.   Por favor, tenga en cuenta que aunque hacemos todo lo posible para estar disponibles para asuntos urgentes fuera del horario de [place], no estamos disponibles las 24 horas del da, los 7 das de la [place].   Si tiene un problema urgente y no puede comunicarse con nosotros, puede optar por buscar atencin mdica  en el consultorio de su doctor(a), en una clnica privada, en un centro de atencin urgente o en una sala de emergencias.  Si tiene Engineering geologist, por favor llame inmediatamente al 911 o vaya a la sala de emergencias.  Nmeros de bper  - Dr. Nehemiah Massed: [phone]  - Dra. Moye: [phone]  - Dra. Nicole Kindred: [phone]  En caso de inclemencias del [place], por favor llame a Johnsie Kindred principal al [phone] para una actualizacin sobre el [place] de cualquier retraso o cierre.  Consejos para la medicacin en dermatologa: Por favor, guarde las cajas en las que vienen los medicamentos de uso tpico para ayudarle a seguir las instrucciones sobre dnde y cmo usarlos. Las farmacias generalmente imprimen las instrucciones del medicamento slo en las cajas y no directamente en los tubos del [place].   Si su medicamento es muy caro, por favor, pngase en contacto con Zigmund Daniel llamando al [phone] y presione la opcin 4 o envenos un mensaje a travs de Pharmacist, community.   No podemos decirle cul ser su copago por los medicamentos por adelantado ya que esto es diferente dependiendo de la cobertura de su seguro. Sin embargo, es posible que podamos encontrar un  medicamento sustituto a Electrical engineer un formulario para que el seguro cubra el medicamento que se considera necesario.   Si se requiere una autorizacin previa para que su compaa de seguros [place] su medicamento, por favor permtanos de 1 a 2 das hbiles para completar este proceso.  Los precios de los medicamentos varan con frecuencia dependiendo del Environmental consultant de dnde se surte la receta y alguna farmacias pueden ofrecer precios ms baratos.  El sitio web www.goodrx.com tiene cupones para medicamentos de Airline pilot. Los precios aqu no tienen en cuenta lo que podra costar con la ayuda del seguro (puede ser ms barato con su seguro), pero el sitio web puede darle el precio si no utiliz Research scientist (physical sciences).  - Puede imprimir el cupn correspondiente y llevarlo con su receta a la farmacia.  - Tambin puede pasar por nuestra oficina durante el horario de atencin regular y Charity fundraiser una tarjeta de cupones de GoodRx.  - Si necesita que su receta se enve electrnicamente a  una farmacia diferente, informe a nuestra oficina a travs de MyChart de [place] o por telfono llamando al [phone] y presione la opcin 4.

## 2022-05-20 ENCOUNTER — Encounter: Payer: Self-pay | Admitting: Gastroenterology

## 2022-05-21 ENCOUNTER — Other Ambulatory Visit: Payer: Self-pay | Admitting: Nurse Practitioner

## 2022-05-21 NOTE — Telephone Encounter (Signed)
Please advise. Pt has an upcoming appointment with [facility]

## 2022-05-22 DIAGNOSIS — M791 Myalgia, unspecified site: Secondary | ICD-10-CM | POA: Diagnosis not present

## 2022-05-22 DIAGNOSIS — R202 Paresthesia of skin: Secondary | ICD-10-CM | POA: Diagnosis not present

## 2022-05-23 ENCOUNTER — Other Ambulatory Visit: Payer: Self-pay | Admitting: Nurse Practitioner

## 2022-05-26 ENCOUNTER — Other Ambulatory Visit: Payer: Self-pay | Admitting: Gastroenterology

## 2022-05-26 DIAGNOSIS — K50119 Crohn's disease of large intestine with unspecified complications: Secondary | ICD-10-CM

## 2022-05-26 DIAGNOSIS — K602 Anal fissure, unspecified: Secondary | ICD-10-CM

## 2022-05-27 ENCOUNTER — Other Ambulatory Visit: Payer: BC Managed Care – PPO

## 2022-05-27 DIAGNOSIS — K50119 Crohn's disease of large intestine with unspecified complications: Secondary | ICD-10-CM | POA: Diagnosis not present

## 2022-06-03 ENCOUNTER — Other Ambulatory Visit: Payer: Self-pay

## 2022-06-03 DIAGNOSIS — K50119 Crohn's disease of large intestine with unspecified complications: Secondary | ICD-10-CM

## 2022-06-03 LAB — CALPROTECTIN, FECAL: Calprotectin, Fecal: 112 ug/g (ref 0–120)

## 2022-06-10 DIAGNOSIS — R200 Anesthesia of skin: Secondary | ICD-10-CM | POA: Diagnosis not present

## 2022-06-10 DIAGNOSIS — M791 Myalgia, unspecified site: Secondary | ICD-10-CM | POA: Diagnosis not present

## 2022-06-17 DIAGNOSIS — U071 COVID-19: Secondary | ICD-10-CM | POA: Diagnosis not present

## 2022-06-17 DIAGNOSIS — R051 Acute cough: Secondary | ICD-10-CM | POA: Diagnosis not present

## 2022-06-18 ENCOUNTER — Encounter: Payer: Self-pay | Admitting: Gastroenterology

## 2022-06-19 ENCOUNTER — Ambulatory Visit: Payer: BC Managed Care – PPO | Admitting: Dermatology

## 2022-07-02 ENCOUNTER — Encounter: Payer: Self-pay | Admitting: Gastroenterology

## 2022-07-04 ENCOUNTER — Encounter: Payer: Self-pay | Admitting: Gastroenterology

## 2022-07-07 ENCOUNTER — Telehealth: Payer: Self-pay

## 2022-07-07 NOTE — Telephone Encounter (Signed)
Received fax stating that prior authorization is needed for humira. PA expiration date is 07/09/22. Fax sent to prior auth team.

## 2022-07-08 ENCOUNTER — Encounter: Payer: Self-pay | Admitting: Dermatology

## 2022-07-08 ENCOUNTER — Ambulatory Visit: Payer: BC Managed Care – PPO | Admitting: Dermatology

## 2022-07-08 VITALS — BP 137/84 | HR 81 | Wt 138.0 lb

## 2022-07-08 DIAGNOSIS — L853 Xerosis cutis: Secondary | ICD-10-CM

## 2022-07-08 DIAGNOSIS — Z79899 Other long term (current) drug therapy: Secondary | ICD-10-CM | POA: Diagnosis not present

## 2022-07-08 DIAGNOSIS — K130 Diseases of lips: Secondary | ICD-10-CM

## 2022-07-08 DIAGNOSIS — L700 Acne vulgaris: Secondary | ICD-10-CM | POA: Diagnosis not present

## 2022-07-08 MED ORDER — ISOTRETINOIN 30 MG PO CAPS: 30.0000 mg | ORAL_CAPSULE | Freq: Every day | ORAL | 0 refills | Status: DC

## 2022-07-08 NOTE — Patient Instructions (Signed)
Due to recent changes in healthcare laws, you may see results of your pathology and/or laboratory studies on MyChart before the doctors have had a chance to review them. We understand that in some cases there may be results that are confusing or concerning to you. Please understand that not all results are received at the same time and often the doctors may need to interpret multiple results in order to provide you with the best plan of care or course of treatment. Therefore, we ask that you please give us 2 business days to thoroughly review all your results before contacting the office for clarification. Should we see a critical lab result, you will be contacted sooner.   If You Need Anything After Your Visit  If you have any questions or concerns for your doctor, please call our main line at [phone] and press option 4 to reach your doctor's medical assistant. If no one answers, please leave a voicemail as directed and we will return your call as soon as possible. Messages left after 4 pm will be answered the following business day.   You may also send us a message via MyChart. We typically respond to MyChart messages within 1-2 business days.  For prescription refills, please ask your pharmacy to contact our office. Our fax number is [phone].  If you have an urgent issue when the clinic is closed that cannot wait until the next business day, you can page your doctor at the number below.    Please note that while we do our best to be available for urgent issues outside of office hours, we are not available 24/7.   If you have an urgent issue and are unable to reach us, you may choose to seek medical care at your doctor's office, retail clinic, urgent care center, or emergency room.  If you have a medical emergency, please immediately call 911 or go to the emergency department.  Pager Numbers  - Dr. Kowalski: [phone]  - Dr. Moye: [phone]  - Dr. Stewart:  [phone]  In the event of inclement weather, please call our main line at [phone] for an update on the status of any delays or closures.  Dermatology Medication Tips: Please keep the boxes that topical medications come in in order to help keep track of the instructions about where and how to use these. Pharmacies typically print the medication instructions only on the boxes and not directly on the medication tubes.   If your medication is too expensive, please contact our office at [phone] option 4 or send us a message through MyChart.   We are unable to tell what your co-pay for medications will be in advance as this is different depending on your insurance coverage. However, we may be able to find a substitute medication at lower cost or fill out paperwork to get insurance to cover a needed medication.   If a prior authorization is required to get your medication covered by your insurance company, please allow us 1-2 business days to complete this process.  Drug prices often vary depending on where the prescription is filled and some pharmacies may offer cheaper prices.  The website www.goodrx.com contains coupons for medications through different pharmacies. The prices here do not account for what the cost may be with help from insurance (it may be cheaper with your insurance), but the website can give you the price if you did not use any insurance.  - You can print the associated coupon and take it with   your prescription to the pharmacy.  - You may also stop by our office during regular business hours and pick up a GoodRx coupon card.  - If you need your prescription sent electronically to a different pharmacy, notify our office through [facility] MyChart or by phone at [phone] option 4.     Si Usted Necesita Algo Despus de Su Visita  Tambin puede enviarnos un mensaje a travs de MyChart. Por lo general respondemos a los mensajes de MyChart en el transcurso de 1 a 2  das hbiles.  Para renovar recetas, por favor pida a su farmacia que se ponga en contacto con nuestra oficina. Nuestro nmero de fax es el [phone].  Si tiene un asunto urgente cuando la clnica est cerrada y que no puede esperar hasta el siguiente da hbil, puede llamar/localizar a su doctor(a) al nmero que aparece a continuacin.   Por favor, tenga en cuenta que aunque hacemos todo lo posible para estar disponibles para asuntos urgentes fuera del horario de oficina, no estamos disponibles las 24 horas del da, los 7 das de la semana.   Si tiene un problema urgente y no puede comunicarse con nosotros, puede optar por buscar atencin mdica  en el consultorio de su doctor(a), en una clnica privada, en un centro de atencin urgente o en una sala de emergencias.  Si tiene una emergencia mdica, por favor llame inmediatamente al 911 o vaya a la sala de emergencias.  Nmeros de bper  - Dr. Kowalski: [phone]  - Dra. Moye: [phone]  - Dra. Stewart: [phone]  En caso de inclemencias del tiempo, por favor llame a nuestra lnea principal al [phone] para una actualizacin sobre el estado de cualquier retraso o cierre.  Consejos para la medicacin en dermatologa: Por favor, guarde las cajas en las que vienen los medicamentos de uso tpico para ayudarle a seguir las instrucciones sobre dnde y cmo usarlos. Las farmacias generalmente imprimen las instrucciones del medicamento slo en las cajas y no directamente en los tubos del medicamento.   Si su medicamento es muy caro, por favor, pngase en contacto con nuestra oficina llamando al [phone] y presione la opcin 4 o envenos un mensaje a travs de MyChart.   No podemos decirle cul ser su copago por los medicamentos por adelantado ya que esto es diferente dependiendo de la cobertura de su seguro. Sin embargo, es posible que podamos encontrar un medicamento sustituto a menor costo o llenar un formulario para que el  seguro cubra el medicamento que se considera necesario.   Si se requiere una autorizacin previa para que su compaa de seguros cubra su medicamento, por favor permtanos de 1 a 2 das hbiles para completar este proceso.  Los precios de los medicamentos varan con frecuencia dependiendo del lugar de dnde se surte la receta y alguna farmacias pueden ofrecer precios ms baratos.  El sitio web www.goodrx.com tiene cupones para medicamentos de diferentes farmacias. Los precios aqu no tienen en cuenta lo que podra costar con la ayuda del seguro (puede ser ms barato con su seguro), pero el sitio web puede darle el precio si no utiliz ningn seguro.  - Puede imprimir el cupn correspondiente y llevarlo con su receta a la farmacia.  - Tambin puede pasar por nuestra oficina durante el horario de atencin regular y recoger una tarjeta de cupones de GoodRx.  - Si necesita que su receta se enve electrnicamente a una farmacia diferente, informe a nuestra oficina a travs de MyChart de [facility]   o por telfono llamando al [phone] y presione la opcin 4.  

## 2022-07-08 NOTE — Progress Notes (Signed)
Isotretinoin Follow-Up Visit   Subjective  Melissa Nicholson is a 32 y.o. female who presents for the following: Follow-up (Patient here today for 30 day accutane follow up. ).  Week # 36 Pharmacy:  [facility] [place] Current Total Dosage: 7700 mg Total mg/kg: 123.00 mg/kg Forms of BC:  Oral Contraceptives and Female Condoms     Isotretinoin F/U - 07/08/22 0800       Isotretinoin Follow Up   iPledge # [id number]    Date 07/08/22    Weight 138 lb (62.6 kg)    Two Forms of Birth Control Oral Contraceptives (w/ estrogen);Female Condom    Acne breakouts since last visit? No      Dosage   Target Dosage (mg) 7363    Current (To Date) Dosage (mg) 7700    To Go Dosage (mg) -337      Side Effects   Skin Chapped Lips    Gastrointestinal WNL    Neurological WNL    Constitutional WNL             Side effects: Dry skin, dry lips  Denies changes in night vision, shortness of breath, abdominal pain, nausea, vomiting, diarrhea, blood in stool or urine, visual changes, headaches, epistaxis, joint pain, myalgias, mood changes, depression, or suicidal ideation.   Patient is not pregnant, not seeking pregnancy, and not breastfeeding.   The following portions of the chart were reviewed this encounter and updated as appropriate: medications, allergies, medical history  Review of Systems:  No other skin or systemic complaints except as noted in HPI or Assessment and Plan.  Objective  Well appearing patient in no apparent distress; mood and affect are within normal limits.  An examination of the face, neck, chest, and back was performed and relevant findings are noted below.   face clear    Assessment & Plan   Acne vulgaris face  Acne is severe and chronic (present >1 year); patient is currently on Isotretinoin, requiring FDA mandated monthly evaluations and laboratory monitoring, and not to goal (must reach target dose based on weight and also have clear  skin for 2 months prior to discontinuation in order to help prevent relapse)     Continue isotretinoin 30 mg once daily.  May be patient's last month of treatment.   Urine pregnancy test performed in office today and was negative.  Patient demonstrates comprehension and confirms she will not get pregnant.   Patient confirmed in iPledge and isotretinoin sent to pharmacy.   Related Medications ISOtretinoin (ACCUTANE) 30 MG capsule Take 1 capsule (30 mg total) by mouth daily.    Xerosis secondary to isotretinoin therapy - Continue emollients as directed - Xyzal (levocetirizine) once a day and fish oil 1 gram daily may also help with dryness - Can use clobetasol twice a day as needed up to 2 weeks for eczema on body. Can use pimecrolimus twice a day as needed for rash anywhere.  Cheilitis secondary to isotretinoin therapy - Continue lip balm as directed, Dr. Luvenia Heller Cortibalm recommended  Long term medication management (isotretinoin) - While taking Isotretinoin and for 30 days after you finish the medication, do not get pregnant, do not share pills, do not donate blood. Isotretinoin is best absorbed when taken with a fatty meal. Isotretinoin can make you sensitive to the sun. Daily careful sun protection including sunscreen SPF 30+ when outdoors is recommended.  Follow-up in 30 days.  Graciella Belton, RMA, am acting as scribe for Forest Gleason, MD .  Documentation: I have reviewed the above documentation for accuracy and completeness, and I agree with the above.  Forest Gleason, MD

## 2022-07-09 ENCOUNTER — Other Ambulatory Visit: Payer: Self-pay

## 2022-07-09 DIAGNOSIS — L700 Acne vulgaris: Secondary | ICD-10-CM

## 2022-07-09 MED ORDER — ISOTRETINOIN 30 MG PO CAPS: 30.0000 mg | ORAL_CAPSULE | Freq: Every day | ORAL | 0 refills | Status: DC

## 2022-07-09 NOTE — Progress Notes (Signed)
Patient called this morning stating pharmacy did not have RX. Re sent today with ipledge #. aw

## 2022-07-11 ENCOUNTER — Other Ambulatory Visit (HOSPITAL_COMMUNITY): Payer: Self-pay

## 2022-07-11 ENCOUNTER — Telehealth: Payer: Self-pay | Admitting: Pharmacy Technician

## 2022-07-11 ENCOUNTER — Encounter: Payer: Self-pay | Admitting: Gastroenterology

## 2022-07-11 ENCOUNTER — Ambulatory Visit (AMBULATORY_SURGERY_CENTER): Payer: BC Managed Care – PPO | Admitting: Gastroenterology

## 2022-07-11 VITALS — BP 106/64 | HR 63 | Temp 98.0°F | Resp 20 | Ht 64.0 in | Wt 139.0 lb

## 2022-07-11 DIAGNOSIS — K50119 Crohn's disease of large intestine with unspecified complications: Secondary | ICD-10-CM

## 2022-07-11 DIAGNOSIS — K529 Noninfective gastroenteritis and colitis, unspecified: Secondary | ICD-10-CM | POA: Diagnosis not present

## 2022-07-11 MED ORDER — SODIUM CHLORIDE 0.9 % IV SOLN: 500.0000 mL | Freq: Once | INTRAVENOUS | Status: DC

## 2022-07-11 NOTE — Progress Notes (Signed)
Vss nad trans to pacu °

## 2022-07-11 NOTE — Progress Notes (Signed)
Referring Provider: Dineen Kid, MD Primary Care Physician:  Dineen Kid, MD  Indication for Procedure: Crohn's disease   IMPRESSION:  Crohn's disease Appropriate candidate for monitored anesthesia care  PLAN: Colonoscopy in the [facility] today   HPI: Melissa Nicholson is a 32 y.o. female presents for colonoscopy to evaluate for histologic remission of Crohn's disease.  Past Medical History:  Diagnosis Date   Acne 2023   isotretinoin   Anxiety    Atypical mole 06/13/2016   lower mid back mild   Chronic anal fissure    COVID 06/2019   sob, chest pain loss of taste and smell fever coughx 1 week all symptoms resolved   COVID-19 11/2020   Crohn's colitis ([place])    Fistula, anal    Wears glasses     Past Surgical History:  Procedure Laterality Date   ANAL FISSURE REPAIR     with botox   COLONOSCOPY     lumps removed from both breasts Bilateral 2011   fibrous adenomas benign   RECTAL EXAM UNDER ANESTHESIA N/A 06/21/2020   Procedure: ANORECTAL EXAM UNDER ANESTHESIA. INJECTION OF ANAL BOTOX;  Surgeon: Ileana Roup, MD;  Location: [facility];  Service: General;  Laterality: N/A;   WISDOM TOOTH EXTRACTION      Current Outpatient Medications  Medication Sig Dispense Refill   citalopram (CELEXA) 20 MG tablet Take 20 mg by mouth daily.     ELDERBERRY PO Take by mouth. daily     HUMIRA PEN 40 MG/0.4ML PNKT INJECT 40 MG (0.4 ML) UNDER THE SKIN EVERY 14 DAYS 2 each 6   ISOtretinoin (ACCUTANE) 30 MG capsule Take 1 capsule (30 mg total) by mouth daily. 30 capsule 0   LO LOESTRIN FE 1 MG-10 MCG / 10 MCG tablet Take 1 tablet by mouth daily.     clobetasol cream (TEMOVATE) [id number] % Apply 1 Application topically 2 (two) times daily. Avoid applying to face, groin, and axilla. Use as directed. Long-term use can cause thinning of the skin. (Patient not taking: Reported on 05/09/2022) 30 g 0   mesalamine (LIALDA) 1.2 g EC tablet TAKE 2 TABLETS (2.4 G TOTAL) BY MOUTH DAILY  WITH BREAKFAST. TAKE 2 TABLETS DAILY W/ BREAKFAST (Patient not taking: Reported on 07/11/2022) 180 tablet 1   mupirocin ointment (BACTROBAN) 2 % Apply 1 Application topically 2 (two) times daily. For lips until healed. (Patient not taking: Reported on 05/09/2022) 22 g 0   OVER THE COUNTER MEDICATION Probiotic gummy- one daily (Patient not taking: Reported on 07/11/2022)     pimecrolimus (ELIDEL) 1 % cream Apply topically 2 (two) times daily. (Patient not taking: Reported on 07/11/2022) 30 g 0   Current Facility-Administered Medications  Medication Dose Route Frequency Provider Last Rate Last Admin   0.9 %  sodium chloride infusion  500 mL Intravenous Once Thornton Park, MD        Allergies as of 07/11/2022 - Review Complete 07/11/2022  Allergen Reaction Noted   Crab (diagnostic)  06/19/2020    Family History  Problem Relation Age of Onset   Heart block Father    Colon cancer Paternal Grandmother    Stomach cancer Neg Hx    Esophageal cancer Neg Hx    Pancreatic cancer Neg Hx    Rectal cancer Neg Hx      Physical Exam: General:   Alert,  well-nourished, pleasant and cooperative in NAD Head:  Normocephalic and atraumatic. Eyes:  Sclera clear, no icterus.   Conjunctiva  pink. Mouth:  No deformity or lesions.   Neck:  Supple; no masses or thyromegaly. Lungs:  Clear throughout to auscultation.   No wheezes. Heart:  Regular rate and rhythm; no murmurs. Abdomen:  Soft, non-tender, nondistended, normal bowel sounds, no rebound or guarding.  Msk:  Symmetrical. No boney deformities LAD: No inguinal or umbilical LAD Extremities:  No clubbing or edema. Neurologic:  Alert and  oriented x4;  grossly nonfocal Skin:  No obvious rash or bruise. Psych:  Alert and cooperative. Normal mood and affect.     Studies/Results: No results found.    Rielly Brunn L. Tarri Glenn, MD, MPH 07/11/2022, 7:31 AM

## 2022-07-11 NOTE — Telephone Encounter (Signed)
Patient Advocate Encounter  Received notification from [facility] that prior authorization for HUMIRA 40MG is required.   PA submitted on 2.9.24 Key [id number] Status is pending

## 2022-07-11 NOTE — Patient Instructions (Addendum)
Await pathology results. Resume previous diet and continue present medications. Repeat colonoscopy date to be determined after pending pathology results are reviewed for surveillance. Office follow-up as recommended  Emerging evidence supports eating a diet of fruits, vegetables, grains, calcium, and yogurt while reducing red meat and alcohol may reduce the risk of colon cancer.    YOU HAD AN ENDOSCOPIC PROCEDURE TODAY AT [facility]:   Refer to the procedure report that was given to you for any specific questions about what was found during the examination.  If the procedure report does not answer your questions, please call your gastroenterologist to clarify.  If you requested that your care partner not be given the details of your procedure findings, then the procedure report has been included in a sealed envelope for you to review at your convenience later.  YOU SHOULD EXPECT: Some feelings of bloating in the abdomen. Passage of more gas than usual.  Walking can help get rid of the air that was put into your GI tract during the procedure and reduce the bloating. If you had a lower endoscopy (such as a colonoscopy or flexible sigmoidoscopy) you may notice spotting of blood in your stool or on the toilet paper. If you underwent a bowel prep for your procedure, you may not have a normal bowel movement for a few days.  Please Note:  You might notice some irritation and congestion in your nose or some drainage.  This is from the oxygen used during your procedure.  There is no need for concern and it should clear up in a day or so.  SYMPTOMS TO REPORT IMMEDIATELY:  Following lower endoscopy (colonoscopy or flexible sigmoidoscopy):  Excessive amounts of blood in the stool  Significant tenderness or worsening of abdominal pains  Swelling of the abdomen that is new, acute  Fever of 100F or higher  For urgent or emergent issues, a gastroenterologist can be reached at any hour by  calling [phone]. Do not use MyChart messaging for urgent concerns.    DIET:  We do recommend a small meal at first, but then you may proceed to your regular diet.  Drink plenty of fluids but you should avoid alcoholic beverages for 24 hours.  ACTIVITY:  You should plan to take it easy for the rest of today and you should NOT DRIVE or use heavy machinery until tomorrow (because of the sedation medicines used during the test).    FOLLOW UP: Our staff will call the number listed on your records the next business day following your procedure.  We will call around 7:15- 8:00 am to check on you and address any questions or concerns that you may have regarding the information given to you following your procedure. If we do not reach you, we will leave a message.     If any biopsies were taken you will be contacted by phone or by letter within the next 1-3 weeks.  Please call us at [phone] if you have not heard about the biopsies in 3 weeks.    SIGNATURES/CONFIDENTIALITY: You and/or your care partner have signed paperwork which will be entered into your electronic medical record.  These signatures attest to the fact that that the information above on your After Visit Summary has been reviewed and is understood.  Full responsibility of the confidentiality of this discharge information lies with you and/or your care-partner.

## 2022-07-11 NOTE — Op Note (Signed)
[facility] Patient Name: Melissa Nicholson Procedure Date: 07/11/2022 8:20 AM MRN: [id number] Endoscopist: Thornton Park MD, MD, [id number] Age: 32 Referring MD:  Date of Birth: 1990-07-03 Gender: Female Account #: [id number] Procedure:                Colonoscopy Indications:              Assess therapeutic response to therapy of Crohn's                            disease of the small bowel and colon                           Clinical improvement on Humira, colonoscopy                            recommending tp assess for histologic remission Medicines:                Monitored Anesthesia Care Procedure:                Pre-Anesthesia Assessment:                           - Prior to the procedure, a History and Physical                            was performed, and patient medications and                            allergies were reviewed. The patient's tolerance of                            previous anesthesia was also reviewed. The risks                            and benefits of the procedure and the sedation                            options and risks were discussed with the patient.                            All questions were answered, and informed consent                            was obtained. Prior Anticoagulants: The patient has                            taken no anticoagulant or antiplatelet agents. ASA                            Grade Assessment: II - A patient with mild systemic                            disease. After reviewing the risks and benefits,  the patient was deemed in satisfactory condition to                            undergo the procedure.                           After obtaining informed consent, the colonoscope                            was passed under direct vision. Throughout the                            procedure, the patient's blood pressure, pulse, and                            oxygen saturations were  monitored continuously. The                            Olympus CF-HQ190L [id number]) Colonoscope was                            introduced through the anus and advanced to the 5                            cm into the ileum. The colonoscopy was performed                            without difficulty. The patient tolerated the                            procedure well. The quality of the bowel                            preparation was excellent. The terminal ileum,                            ileocecal valve, appendiceal orifice, and rectum                            were photographed. Scope In: 8:46:29 AM Scope Out: 8:58:32 AM Scope Withdrawal Time: 0 hours 9 minutes 11 seconds  Total Procedure Duration: 0 hours 12 minutes 3 seconds  Findings:                 The perianal exam was abnormal. 3 mm opening                            posterior/right to and very close to the anal                            verge. This appears to be epithelialized. Pallor at                            the base of the opening. No drainage could be  expressed on today's exam. No perianal tenderness,                            fluctuance.                           The entire examined colonic mucosa appeared normal.                            Biopsies were taken from the right colon,                            transverse colon, left colon, and rectum with a                            cold forceps for histology. Estimated blood loss                            was minimal.                           The terminal ileum appeared normal. Biopsies were                            taken with a cold forceps for histology. Estimated                            blood loss was minimal.                           The exam was otherwise without abnormality on                            direct and retroflexion views. Complications:            No immediate complications. Estimated Blood Loss:     Estimated  blood loss was minimal. Impression:               - Superficial perianal fistula.                           - The entire examined colon is normal. Biopsied.                           - The examined portion of the ileum was normal.                            Biopsied.                           - The examination was otherwise normal on direct                            and retroflexion views. Recommendation:           - Patient has a contact number available for  emergencies. The signs and symptoms of potential                            delayed complications were discussed with the                            patient. Return to normal activities tomorrow.                            Written discharge instructions were provided to the                            patient.                           - Resume previous diet.                           - Continue present medications.                           - Await pathology results.                           - Repeat colonoscopy date to be determined after                            pending pathology results are reviewed for                            surveillance.                           - Office follow-up as recommended.                           - Emerging evidence supports eating a diet of                            fruits, vegetables, grains, calcium, and yogurt                            while reducing red meat and alcohol may reduce the                            risk of colon cancer. Thornton Park MD, MD 07/11/2022 9:10:29 AM This report has been signed electronically.

## 2022-07-11 NOTE — Progress Notes (Signed)
Called to room to assist during endoscopic procedure.  Patient ID and intended procedure confirmed with present staff. Received instructions for my participation in the procedure from the performing physician.  

## 2022-07-14 ENCOUNTER — Telehealth: Payer: Self-pay

## 2022-07-14 NOTE — Telephone Encounter (Signed)
  Follow up Call-     07/11/2022    7:18 AM 09/19/2020    2:37 PM  Call back number  Post procedure Call Back phone  # [phone]  Permission to leave phone message Yes Yes     Patient questions:  Do you have a fever, pain , or abdominal swelling? No. Pain Score  0 *  Have you tolerated food without any problems? Yes.    Have you been able to return to your normal activities? Yes.    Do you have any questions about your discharge instructions: Diet   No. Medications  No. Follow up visit  No.  Do you have questions or concerns about your Care? No.  Actions: * If pain score is 4 or above: No action needed, pain <4.

## 2022-07-21 NOTE — Telephone Encounter (Signed)
Received notification from [facility] regarding a prior authorization for [place]. Authorization has been APPROVED from 07/11/22 to 07/10/23.   Authorization # Key: [id number]

## 2022-07-21 NOTE — Telephone Encounter (Signed)
Left message for pt to call back  °

## 2022-07-21 NOTE — Telephone Encounter (Signed)
Pt made aware of the PA approval for the Humira. Pt verbalized understanding with all questions answered.

## 2022-08-13 ENCOUNTER — Encounter: Payer: Self-pay | Admitting: Dermatology

## 2022-08-13 ENCOUNTER — Ambulatory Visit: Payer: BC Managed Care – PPO | Admitting: Dermatology

## 2022-08-13 VITALS — BP 130/77 | HR 73 | Wt 138.0 lb

## 2022-08-13 DIAGNOSIS — L309 Dermatitis, unspecified: Secondary | ICD-10-CM | POA: Diagnosis not present

## 2022-08-13 DIAGNOSIS — Z79899 Other long term (current) drug therapy: Secondary | ICD-10-CM | POA: Diagnosis not present

## 2022-08-13 DIAGNOSIS — L700 Acne vulgaris: Secondary | ICD-10-CM

## 2022-08-13 DIAGNOSIS — K130 Diseases of lips: Secondary | ICD-10-CM

## 2022-08-13 NOTE — Patient Instructions (Signed)
Due to recent changes in healthcare laws, you may see results of your pathology and/or laboratory studies on MyChart before the doctors have had a chance to review them. We understand that in some cases there may be results that are confusing or concerning to you. Please understand that not all results are received at the same time and often the doctors may need to interpret multiple results in order to provide you with the best plan of care or course of treatment. Therefore, we ask that you please give us 2 business days to thoroughly review all your results before contacting the office for clarification. Should we see a critical lab result, you will be contacted sooner.   If You Need Anything After Your Visit  If you have any questions or concerns for your doctor, please call our main line at [phone] and press option 4 to reach your doctor's medical assistant. If no one answers, please leave a voicemail as directed and we will return your call as soon as possible. Messages left after 4 pm will be answered the following business day.   You may also send us a message via MyChart. We typically respond to MyChart messages within 1-2 business days.  For prescription refills, please ask your pharmacy to contact our office. Our fax number is [phone].  If you have an urgent issue when the clinic is closed that cannot wait until the next business day, you can page your doctor at the number below.    Please note that while we do our best to be available for urgent issues outside of office hours, we are not available 24/7.   If you have an urgent issue and are unable to reach us, you may choose to seek medical care at your doctor's office, retail clinic, urgent care center, or emergency room.  If you have a medical emergency, please immediately call 911 or go to the emergency department.  Pager Numbers  - Dr. Kowalski: [phone]  - Dr. Moye: [phone]  - Dr. Stewart:  [phone]  In the event of inclement weather, please call our main line at [phone] for an update on the status of any delays or closures.  Dermatology Medication Tips: Please keep the boxes that topical medications come in in order to help keep track of the instructions about where and how to use these. Pharmacies typically print the medication instructions only on the boxes and not directly on the medication tubes.   If your medication is too expensive, please contact our office at [phone] option 4 or send us a message through MyChart.   We are unable to tell what your co-pay for medications will be in advance as this is different depending on your insurance coverage. However, we may be able to find a substitute medication at lower cost or fill out paperwork to get insurance to cover a needed medication.   If a prior authorization is required to get your medication covered by your insurance company, please allow us 1-2 business days to complete this process.  Drug prices often vary depending on where the prescription is filled and some pharmacies may offer cheaper prices.  The website www.goodrx.com contains coupons for medications through different pharmacies. The prices here do not account for what the cost may be with help from insurance (it may be cheaper with your insurance), but the website can give you the price if you did not use any insurance.  - You can print the associated coupon and take it with   your prescription to the pharmacy.  - You may also stop by our office during regular business hours and pick up a GoodRx coupon card.  - If you need your prescription sent electronically to a different pharmacy, notify our office through [facility] MyChart or by phone at [phone] option 4.     Si Usted Necesita Algo Despus de Su Visita  Tambin puede enviarnos un mensaje a travs de MyChart. Por lo general respondemos a los mensajes de MyChart en el transcurso de 1 a 2  das hbiles.  Para renovar recetas, por favor pida a su farmacia que se ponga en contacto con nuestra oficina. Nuestro nmero de fax es el [phone].  Si tiene un asunto urgente cuando la clnica est cerrada y que no puede esperar hasta el siguiente da hbil, puede llamar/localizar a su doctor(a) al nmero que aparece a continuacin.   Por favor, tenga en cuenta que aunque hacemos todo lo posible para estar disponibles para asuntos urgentes fuera del horario de oficina, no estamos disponibles las 24 horas del da, los 7 das de la semana.   Si tiene un problema urgente y no puede comunicarse con nosotros, puede optar por buscar atencin mdica  en el consultorio de su doctor(a), en una clnica privada, en un centro de atencin urgente o en una sala de emergencias.  Si tiene una emergencia mdica, por favor llame inmediatamente al 911 o vaya a la sala de emergencias.  Nmeros de bper  - Dr. Kowalski: [phone]  - Dra. Moye: [phone]  - Dra. Stewart: [phone]  En caso de inclemencias del tiempo, por favor llame a nuestra lnea principal al [phone] para una actualizacin sobre el estado de cualquier retraso o cierre.  Consejos para la medicacin en dermatologa: Por favor, guarde las cajas en las que vienen los medicamentos de uso tpico para ayudarle a seguir las instrucciones sobre dnde y cmo usarlos. Las farmacias generalmente imprimen las instrucciones del medicamento slo en las cajas y no directamente en los tubos del medicamento.   Si su medicamento es muy caro, por favor, pngase en contacto con nuestra oficina llamando al [phone] y presione la opcin 4 o envenos un mensaje a travs de MyChart.   No podemos decirle cul ser su copago por los medicamentos por adelantado ya que esto es diferente dependiendo de la cobertura de su seguro. Sin embargo, es posible que podamos encontrar un medicamento sustituto a menor costo o llenar un formulario para que el  seguro cubra el medicamento que se considera necesario.   Si se requiere una autorizacin previa para que su compaa de seguros cubra su medicamento, por favor permtanos de 1 a 2 das hbiles para completar este proceso.  Los precios de los medicamentos varan con frecuencia dependiendo del lugar de dnde se surte la receta y alguna farmacias pueden ofrecer precios ms baratos.  El sitio web www.goodrx.com tiene cupones para medicamentos de diferentes farmacias. Los precios aqu no tienen en cuenta lo que podra costar con la ayuda del seguro (puede ser ms barato con su seguro), pero el sitio web puede darle el precio si no utiliz ningn seguro.  - Puede imprimir el cupn correspondiente y llevarlo con su receta a la farmacia.  - Tambin puede pasar por nuestra oficina durante el horario de atencin regular y recoger una tarjeta de cupones de GoodRx.  - Si necesita que su receta se enve electrnicamente a una farmacia diferente, informe a nuestra oficina a travs de MyChart de    o por telfono llamando al [phone] y presione la opcin 4.  

## 2022-08-13 NOTE — Progress Notes (Signed)
Isotretinoin Follow-Up Visit   Subjective  Melissa Nicholson is a 32 y.o. female who presents for the following: Acne (Isotretinoin week 40 - 30 mg po QD, patient c/o xerosis but no other s/e. She finished her last pill yesterday).  Week # 40   Isotretinoin F/U - 08/13/22 0800       Isotretinoin Follow Up   iPledge # [id number]    Date 08/13/22    Weight 138 lb (62.6 kg)    Two Forms of Birth Control Oral Contraceptives (w/ estrogen);Female Condom    Acne breakouts since last visit? No      Dosage   Target Dosage (mg) 7363    Current (To Date) Dosage (mg) 8600    To Go Dosage (mg) -1237      Side Effects   Skin Chapped Lips;Dry Skin    Gastrointestinal WNL    Neurological WNL    Constitutional WNL              Side effects: Dry skin, dry lips  Denies changes in night vision, shortness of breath, abdominal pain, nausea, vomiting, diarrhea, blood in stool or urine, visual changes, headaches, epistaxis, joint pain, myalgias, mood changes, depression, or suicidal ideation.   Patient is not pregnant, not seeking pregnancy, and not breastfeeding.   The following portions of the chart were reviewed this encounter and updated as appropriate: medications, allergies, medical history  Review of Systems:  No other skin or systemic complaints except as noted in HPI or Assessment and Plan.  Objective  Well appearing patient in no apparent distress; mood and affect are within normal limits.  An examination of the face, neck, chest, and back was performed and relevant findings are noted below.   Face Clear.  B/L hands Scaly pink papules coalescing to plaques    Assessment & Plan   Acne vulgaris Face  Chronic condition well controlled and with side effect of treatment.   Discussed continuing Isotretinoin for one more month to reach goal of 150 mg/kg, but patient declines and prefers to stop treatment. Pt to contact office for Tretinoin should she start having  flares again.   Total dose 8,600 mg  Total mg/kg 136.5 mg/kg   In office pregnancy test negative today.   Discussed medication will be in her system for another 30 days. She will send in pregnancy test after that.   Related Medications ISOtretinoin (ACCUTANE) 30 MG capsule Take 1 capsule (30 mg total) by mouth daily.  Cheilitis Lips  Angular Cheilitis is skin irritation in folds of corners of mouth.  A moist environment from saliva promotes overgrowth of yeast and bacteria, resulting in chronic inflammation in this area.  Secondary to Isotretinoin -   Continue Pimecrolimus BID, ketoconazole cream BID, and Mupirocin 2% ointment BID PRN. If not improving in a few days patient to contact office.   Related Medications pimecrolimus (ELIDEL) 1 % cream Apply topically 2 (two) times daily.  mupirocin ointment (BACTROBAN) 2 % Apply 1 Application topically 2 (two) times daily. For lips until healed.  Hand dermatitis B/L hands  Secondary to Isotretinoin -   Continue Pimecrolimus to aa's hands BID PRN. Pt defers other topical treatment at this time.     Xerosis secondary to isotretinoin therapy - Continue emollients as directed - Xyzal (levocetirizine) once a day and fish oil 1 gram daily may also help with dryness  Long term medication management (isotretinoin) - While taking Isotretinoin and for 30 days after you finish the  medication, do not get pregnant, do not share pills, do not donate blood. Isotretinoin is best absorbed when taken with a fatty meal. Isotretinoin can make you sensitive to the sun. Daily careful sun protection including sunscreen SPF 30+ when outdoors is recommended.  Return to clinic as needed.   Luther Redo, CMA, am acting as scribe for Forest Gleason, MD .  Documentation: I have reviewed the above documentation for accuracy and completeness, and I agree with the above.  Forest Gleason, MD

## 2022-09-09 ENCOUNTER — Ambulatory Visit: Payer: BC Managed Care – PPO | Admitting: Gastroenterology

## 2022-09-09 ENCOUNTER — Encounter: Payer: Self-pay | Admitting: Gastroenterology

## 2022-09-09 ENCOUNTER — Other Ambulatory Visit (INDEPENDENT_AMBULATORY_CARE_PROVIDER_SITE_OTHER): Payer: BC Managed Care – PPO

## 2022-09-09 VITALS — BP 106/72 | HR 81 | Ht 64.0 in | Wt 138.0 lb

## 2022-09-09 DIAGNOSIS — K50119 Crohn's disease of large intestine with unspecified complications: Secondary | ICD-10-CM

## 2022-09-09 DIAGNOSIS — Z79620 Long term (current) use of immunosuppressive biologic: Secondary | ICD-10-CM

## 2022-09-09 LAB — CBC WITH DIFFERENTIAL/PLATELET
Basophils Absolute: 0.1 10*3/uL (ref 0.0–0.1)
Basophils Relative: 0.7 % (ref 0.0–3.0)
Eosinophils Absolute: 0.1 10*3/uL (ref 0.0–0.7)
Eosinophils Relative: 0.7 % (ref 0.0–5.0)
HCT: 39.5 % (ref 36.0–46.0)
Hemoglobin: 13.4 g/dL (ref 12.0–15.0)
Lymphocytes Relative: 42.5 % (ref 12.0–46.0)
Lymphs Abs: 3.2 10*3/uL (ref 0.7–4.0)
MCHC: 33.8 g/dL (ref 30.0–36.0)
MCV: 91.2 fl (ref 78.0–100.0)
Monocytes Absolute: 0.4 10*3/uL (ref 0.1–1.0)
Monocytes Relative: 5.4 % (ref 3.0–12.0)
Neutro Abs: 3.8 10*3/uL (ref 1.4–7.7)
Neutrophils Relative %: 50.7 % (ref 43.0–77.0)
Platelets: 204 10*3/uL (ref 150.0–400.0)
RBC: 4.34 Mil/uL (ref 3.87–5.11)
RDW: 12.5 % (ref 11.5–15.5)
WBC: 7.4 10*3/uL (ref 4.0–10.5)

## 2022-09-09 NOTE — Patient Instructions (Addendum)
Continue current medication regimen.   [facility] - Langston Reusing, DO [address], [place], [place] [address] [phone]  Follow up in 6 months or sooner if needed, Alcide Evener, NP

## 2022-09-09 NOTE — Progress Notes (Addendum)
Referring Provider: Iva Boop, MD Primary Care Physician:  Iva Boop, MD  Chief complaint:  Crohn's disease   IMPRESSION:  Ileocolonic Crohn's disease with perianal fistula: In clinical and histologic remission on Humira. Seen at [facility] on one occasion and will return PRN. Will continue adalimumab every other week indefinitely.   Perianal fistula. Significantly improved on adalimumab. However, improvement in drainage has plateaued. Could consider adding ciprofloxacin and metronidazole to reduce drainage though the effects of these would likely be temporary. Alternatively, could consider adding azathioprine to adalimumab though this may take 3 months to determine if that will help. Yet another option would be to change adalimumab to infliximab combined with azathioprine. Dr. Stevphen Rochester did not think the potential risks of changing medical therapy for the sole purpose of decreasing fistula drainage outweigh the potential benefits. Colorectal surgeon consultation for fistulotomy recommended. She continues to wish to defer any additional treatment at this time as she is overall feeling better.    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 Recommend Shingrix shingles vaccine series and Prevnar-20 if not already vaccinated against pneumococcal pneumonia.  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 Also seen by Dr. Willeen Cass with ENT 2023.   Anal fissures s/p chemical sphincterotomy with injection of perianal Botox on 06/21/2020  PLAN: - CBC every 3 months - QuantiFERON-TB, HBsAg - Fecal calprotectin to monitor response to therapy - Continue Humira 40 mg dosed every 2 weeks - Continue calcium and vitamin D supplementations - Annual dermatology head-to-toe exam - Annual follow-up with GYN - Follow-up with [facility]/Surgeon if perianal fistula is not improving - Shingrix vaccine - Follow-up in 6 months, earlier if needed   Please see the "Patient Instructions" section for  addition details about the plan.  HPI: Melissa Nicholson is a 32 y.o. female who returns in follow-up of Crohn's disease with perianal fistula.  Interval history is obtained through the patient, review of her electronic health record, and review of records from her consultation at the [facility]  She feels the best that she has in 2 years.Minimal drainage from the fistula. She remains resistant to surgery. She feels like she can live her symptoms. Does not affect quality of life.  Having one or two formed bowel movement daily. Rare blood occuring 1-2 times/month. Energy is better. Appete is better.  Some tenderness at the fistula.    Brief IBD Disease Course:  Colonic Crohn's disease with perianal fistula. 08/2019, perianal pain and diarrhea. 06/2020, diagnosed with anal fissure and injected with Botox without relief. 08/2020, MRI showed perianal fistula. 09/19/2020, colonoscopy confirmed Crohn's colitis. 09/2020, failed mesalamine and budesonide. 12/06/2020, start adalimumab. Significant symptomatic improvement, but persistent mild perianal drainage.  Fecal calprotectin 673 10/02/20. Fecal calprotectin 06/06/21 19.   Endoscopy:  - Colonoscopy 09/19/20 - inflammation throughout the entire colon except for the distal sigmoid and rectum.  Biopsies of the right colon showed ulcer with moderate active colitis, transverse colon with focal active colitis, left colon and rectum without significant pathologic findings.  The TI was normal.  The pathology report was consistent with IBD. - Colonoscopy 02/09/23 - endoscopically normal TI and colonoscopy. Focal active colitis on one biopsy from the right colon.   IBD health maintenance: Influenza vaccine: Receives annually, last 01/2022 Pneumonia vaccine: Prevnar Hepatitis B: Completed for vaccine TB testing: QuantiFERON 10/02/20 Chickenpox/Shingles history: Chicken pox in preschool Bone denistometry: None Derm appointment: Dermatologist, previously PRN Last small  bowel imaging: 08/2020 Last colonoscopy: 07/2022 Not considering family planning  for another 2-3 years: She will alert me if she becomes pregnant  Past Medical History:  Diagnosis Date   Acne 2023   isotretinoin   Anxiety    Atypical mole 06/13/2016   lower mid back mild   Chronic anal fissure    COVID 06/2019   sob, chest pain loss of taste and smell fever coughx 1 week all symptoms resolved   COVID-19 11/2020   Crohn's colitis    Fistula, anal    Wears glasses     Past Surgical History:  Procedure Laterality Date   ANAL FISSURE REPAIR     with botox   COLONOSCOPY     lumps removed from both breasts Bilateral 2011   fibrous adenomas benign   RECTAL EXAM UNDER ANESTHESIA N/A 06/21/2020   Procedure: ANORECTAL EXAM UNDER ANESTHESIA. INJECTION OF ANAL BOTOX;  Surgeon: Andria MeuseWhite, Christopher M, MD;  [facility];  Service: General;  Laterality: N/A;   WISDOM TOOTH EXTRACTION      Current Outpatient Medications  Medication Sig Dispense Refill   calcium carbonate (TITRALAC) 420 MG CHEW chewable tablet Chew 420 mg by mouth.     citalopram (CELEXA) 20 MG tablet Take 20 mg by mouth daily.     HUMIRA PEN 40 MG/0.4ML PNKT INJECT 40 MG (0.4 ML) UNDER THE SKIN EVERY 14 DAYS 2 each 6   NONFORMULARY OR COMPOUNDED ITEM Pt taking neonatal vitamins     Omega-3 Fatty Acids (FISH OIL CONCENTRATE PO) Take by mouth.     clobetasol cream (TEMOVATE) 0.05 % Apply 1 Application topically 2 (two) times daily. Avoid applying to face, groin, and axilla. Use as directed. Long-term use can cause thinning of the skin. (Patient not taking: Reported on 09/09/2022) 30 g 0   ELDERBERRY PO Take by mouth. daily (Patient not taking: Reported on 09/09/2022)     ISOtretinoin (ACCUTANE) 30 MG capsule Take 1 capsule (30 mg total) by mouth daily. (Patient not taking: Reported on 09/09/2022) 30 capsule 0   LO LOESTRIN FE 1 MG-10 MCG / 10 MCG tablet Take 1 tablet by mouth daily. (Patient not taking: Reported  on 09/09/2022)     mesalamine (LIALDA) 1.2 g EC tablet TAKE 2 TABLETS (2.4 G TOTAL) BY MOUTH DAILY WITH BREAKFAST. TAKE 2 TABLETS DAILY W/ BREAKFAST (Patient not taking: Reported on 07/11/2022) 180 tablet 1   mupirocin ointment (BACTROBAN) 2 % Apply 1 Application topically 2 (two) times daily. For lips until healed. (Patient not taking: Reported on 05/09/2022) 22 g 0   OVER THE COUNTER MEDICATION Probiotic gummy- one daily (Patient not taking: Reported on 07/11/2022)     pimecrolimus (ELIDEL) 1 % cream Apply topically 2 (two) times daily. (Patient not taking: Reported on 07/11/2022) 30 g 0   No current facility-administered medications for this visit.    Allergies as of 09/09/2022 - Review Complete 09/09/2022  Allergen Reaction Noted   Crab (diagnostic)  06/19/2020    Family History  Problem Relation Age of Onset   Heart block Father    Colon cancer Paternal Grandmother    Stomach cancer Neg Hx    Esophageal cancer Neg Hx    Pancreatic cancer Neg Hx    Rectal cancer Neg Hx      Physical Exam: General:   Alert,  well-nourished, pleasant and cooperative in NAD Head:  Normocephalic and atraumatic. Eyes:  Sclera clear, no icterus.   Conjunctiva pink. Abdomen:  Soft,nontender, nondistended, normal bowel sounds, no rebound or guarding. No hepatosplenomegaly.  Rectal:   No chemical dermatitis. Healed anterior fissure and persistent but improved posterior fissures with overlying yellow exudate, small fistula orifice to the right posterior anal area. No exudate. Overall, significant improvement compared to the time of her colonoscopy.  Neurologic:  Alert and  oriented x4;  grossly nonfocal Skin:  Intact without significant lesions or rashes. Psych:  Alert and cooperative. Normal mood and affect.     Nicole Defino L. Orvan Falconer, MD, MPH 09/09/2022, 4:39 PM

## 2022-09-11 ENCOUNTER — Other Ambulatory Visit: Payer: BC Managed Care – PPO

## 2022-09-11 DIAGNOSIS — K50119 Crohn's disease of large intestine with unspecified complications: Secondary | ICD-10-CM | POA: Diagnosis not present

## 2022-09-17 ENCOUNTER — Telehealth: Payer: Self-pay

## 2022-09-17 LAB — CALPROTECTIN, FECAL: Calprotectin, Fecal: 67 ug/g (ref 0–120)

## 2022-09-17 NOTE — Telephone Encounter (Signed)
Patient called concerning that she is feeling a lot of pressure in her head, resembling a headache and wanted to know could this be a side effect from stopping her accutane medication?

## 2022-09-18 ENCOUNTER — Telehealth: Payer: Self-pay

## 2022-09-18 NOTE — Telephone Encounter (Signed)
Called patient discussed Stopping Isotretinoin should not cause a headache for her.

## 2022-09-18 NOTE — Telephone Encounter (Signed)
Stopping the medicine should not cause a headache for her. Thank you

## 2022-09-18 NOTE — Telephone Encounter (Signed)
Patient advised of information per Dr. Moye. aw 

## 2022-11-12 ENCOUNTER — Other Ambulatory Visit: Payer: Self-pay | Admitting: Obstetrics and Gynecology

## 2022-11-12 DIAGNOSIS — N6310 Unspecified lump in the right breast, unspecified quadrant: Secondary | ICD-10-CM

## 2022-11-25 DIAGNOSIS — R0981 Nasal congestion: Secondary | ICD-10-CM | POA: Diagnosis not present

## 2022-12-02 DIAGNOSIS — H1131 Conjunctival hemorrhage, right eye: Secondary | ICD-10-CM | POA: Diagnosis not present

## 2022-12-03 ENCOUNTER — Ambulatory Visit
Admission: RE | Admit: 2022-12-03 | Discharge: 2022-12-03 | Disposition: A | Discharge status: Home / Self Care | Payer: BC Managed Care – PPO | Source: Ambulatory Visit | Attending: Obstetrics and Gynecology | Admitting: Obstetrics and Gynecology

## 2022-12-03 ENCOUNTER — Other Ambulatory Visit: Payer: Self-pay | Admitting: Obstetrics and Gynecology

## 2022-12-03 DIAGNOSIS — N6320 Unspecified lump in the left breast, unspecified quadrant: Secondary | ICD-10-CM

## 2022-12-03 DIAGNOSIS — N6323 Unspecified lump in the left breast, lower outer quadrant: Secondary | ICD-10-CM | POA: Diagnosis not present

## 2022-12-03 DIAGNOSIS — N6310 Unspecified lump in the right breast, unspecified quadrant: Secondary | ICD-10-CM

## 2022-12-03 DIAGNOSIS — N6311 Unspecified lump in the right breast, upper outer quadrant: Secondary | ICD-10-CM | POA: Diagnosis not present

## 2022-12-05 ENCOUNTER — Ambulatory Visit
Admission: RE | Admit: 2022-12-05 | Discharge: 2022-12-05 | Disposition: A | Discharge status: Home / Self Care | Payer: BC Managed Care – PPO | Source: Ambulatory Visit | Attending: Obstetrics and Gynecology | Admitting: Obstetrics and Gynecology

## 2022-12-05 DIAGNOSIS — N6320 Unspecified lump in the left breast, unspecified quadrant: Secondary | ICD-10-CM

## 2022-12-05 DIAGNOSIS — D242 Benign neoplasm of left breast: Secondary | ICD-10-CM | POA: Diagnosis not present

## 2022-12-05 DIAGNOSIS — N6323 Unspecified lump in the left breast, lower outer quadrant: Secondary | ICD-10-CM | POA: Diagnosis not present

## 2022-12-05 HISTORY — PX: BREAST BIOPSY: SHX20

## 2022-12-15 ENCOUNTER — Other Ambulatory Visit: Payer: Self-pay

## 2022-12-15 ENCOUNTER — Telehealth: Payer: Self-pay | Admitting: Nurse Practitioner

## 2022-12-15 DIAGNOSIS — K50119 Crohn's disease of large intestine with unspecified complications: Secondary | ICD-10-CM

## 2022-12-15 DIAGNOSIS — Z79620 Long term (current) use of immunosuppressive biologic: Secondary | ICD-10-CM

## 2022-12-15 NOTE — Telephone Encounter (Signed)
PT is calling to find out about an order being placed for CBC labs before FU appointment.

## 2022-12-15 NOTE — Telephone Encounter (Signed)
Melissa Nicholson, ok to provide lab orders for a CBC, CMP, CRP, vitamin D level and fecal calprotectin level. THX.

## 2022-12-15 NOTE — Telephone Encounter (Signed)
Pt made aware of Alcide Evener NP recommendations. Orders for labs placed in Epic. Pt made aware. Pt verbalized understanding with all questions answered.

## 2022-12-15 NOTE — Telephone Encounter (Signed)
Pt stated that she wants to ensure that the orders for the labs have been placed prior to coming to the lab. Pt stated that she typically gets a Fecal Calprotectin along with blood work every three months. Pt stated that she has done this since she started our practice. Last labs done in April. Pt was  a pt of Dr. Orvan Falconer: Pt previously scheduled to see Alcide Evener NP on 03/11/2023 at 11:00 AM. Please advise of labs.

## 2022-12-22 ENCOUNTER — Other Ambulatory Visit: Payer: Self-pay | Admitting: Gastroenterology

## 2022-12-22 DIAGNOSIS — K50119 Crohn's disease of large intestine with unspecified complications: Secondary | ICD-10-CM

## 2022-12-22 DIAGNOSIS — K602 Anal fissure, unspecified: Secondary | ICD-10-CM

## 2023-01-20 ENCOUNTER — Other Ambulatory Visit (INDEPENDENT_AMBULATORY_CARE_PROVIDER_SITE_OTHER): Payer: BC Managed Care – PPO

## 2023-01-20 DIAGNOSIS — Z79620 Long term (current) use of immunosuppressive biologic: Secondary | ICD-10-CM | POA: Diagnosis not present

## 2023-01-20 DIAGNOSIS — K50119 Crohn's disease of large intestine with unspecified complications: Secondary | ICD-10-CM | POA: Diagnosis not present

## 2023-01-20 LAB — CBC WITH DIFFERENTIAL/PLATELET
Basophils Absolute: 0 10*3/uL (ref 0.0–0.1)
Basophils Relative: 0.6 % (ref 0.0–3.0)
Eosinophils Absolute: 0 10*3/uL (ref 0.0–0.7)
Eosinophils Relative: 0.7 % (ref 0.0–5.0)
HCT: 41.5 % (ref 36.0–46.0)
Hemoglobin: 13.6 g/dL (ref 12.0–15.0)
Lymphocytes Relative: 43.6 % (ref 12.0–46.0)
Lymphs Abs: 3.1 10*3/uL (ref 0.7–4.0)
MCHC: 32.7 g/dL (ref 30.0–36.0)
MCV: 92.9 fl (ref 78.0–100.0)
Monocytes Absolute: 0.4 10*3/uL (ref 0.1–1.0)
Monocytes Relative: 5.2 % (ref 3.0–12.0)
Neutro Abs: 3.6 10*3/uL (ref 1.4–7.7)
Neutrophils Relative %: 49.9 % (ref 43.0–77.0)
Platelets: 199 10*3/uL (ref 150.0–400.0)
RBC: 4.47 Mil/uL (ref 3.87–5.11)
RDW: 12.6 % (ref 11.5–15.5)
WBC: 7.2 10*3/uL (ref 4.0–10.5)

## 2023-01-20 LAB — COMPREHENSIVE METABOLIC PANEL
ALT: 14 U/L (ref 0–35)
AST: 12 U/L (ref 0–37)
Albumin: 4.2 g/dL (ref 3.5–5.2)
Alkaline Phosphatase: 38 U/L — ABNORMAL LOW (ref 39–117)
BUN: 11 mg/dL (ref 6–23)
CO2: 25 mEq/L (ref 19–32)
Calcium: 8.9 mg/dL (ref 8.4–10.5)
Chloride: 103 mEq/L (ref 96–112)
Creatinine, Ser: 0.83 mg/dL (ref 0.40–1.20)
GFR: 92 mL/min (ref >60.00)
Glucose, Bld: 140 mg/dL — ABNORMAL HIGH (ref 70–99)
Potassium: 3.6 mEq/L (ref 3.5–5.1)
Sodium: 135 mEq/L (ref 135–145)
Total Bilirubin: 0.4 mg/dL (ref 0.2–1.2)
Total Protein: 7.6 g/dL (ref 6.0–8.3)

## 2023-01-20 LAB — VITAMIN D 25 HYDROXY (VIT D DEFICIENCY, FRACTURES): VITD: 39.9 ng/mL (ref 30.00–100.00)

## 2023-01-20 LAB — C-REACTIVE PROTEIN: CRP: <1 mg/dL (ref 0.5–20.0)

## 2023-01-22 ENCOUNTER — Other Ambulatory Visit: Payer: BC Managed Care – PPO

## 2023-01-22 DIAGNOSIS — Z79620 Long term (current) use of immunosuppressive biologic: Secondary | ICD-10-CM

## 2023-01-22 DIAGNOSIS — K50119 Crohn's disease of large intestine with unspecified complications: Secondary | ICD-10-CM

## 2023-01-25 LAB — CALPROTECTIN, FECAL: Calprotectin, Fecal: 214 ug/g — ABNORMAL HIGH (ref 0–120)

## 2023-01-30 ENCOUNTER — Other Ambulatory Visit: Payer: Self-pay

## 2023-01-30 DIAGNOSIS — K50119 Crohn's disease of large intestine with unspecified complications: Secondary | ICD-10-CM

## 2023-01-30 NOTE — Telephone Encounter (Signed)
Melissa Nicholson, pls contact patient and let her know I received her message and it is good news that she is not having any colitis flares or anal rectal bleeding/pain.  I would recommend repeating a fecal calprotectin level in 1 month, please provide her with a lab order thank you.

## 2023-02-11 DIAGNOSIS — Z23 Encounter for immunization: Secondary | ICD-10-CM | POA: Diagnosis not present

## 2023-03-11 ENCOUNTER — Ambulatory Visit: Payer: BC Managed Care – PPO | Admitting: Nurse Practitioner

## 2023-03-11 ENCOUNTER — Encounter: Payer: Self-pay | Admitting: Nurse Practitioner

## 2023-03-11 VITALS — BP 110/72 | HR 73 | Ht 65.0 in | Wt 137.4 lb

## 2023-03-11 DIAGNOSIS — K50119 Crohn's disease of large intestine with unspecified complications: Secondary | ICD-10-CM

## 2023-03-11 NOTE — Progress Notes (Signed)
03/11/2023 Melissa Nicholson [id number] 1990-07-14   Chief Complaint: Crohn's follow up, possible flare symptoms   History of Present Illness: Melissa Nicholson is a 32 year old female with a past medical history of anxiety, Covid 19 infection and anal fissures s/p chemical sphincterotomy with injection of perianal Botox on 06/21/2020 then subsequently diagnosed with Ileocolonic Crohn's disease with perianal fistula per image studies and colonoscopy 08/2020. She was started on Humira 12/06/2020. Previously followed by Dr. Orvan Falconer. She denies having any abdominal pain but has intermittent lower abdominal cramping for the past 3 weeks. She passes 1 to 2 formed  brown stools daily. 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 She was previously referred to colorectal surgeon Dr. Noel Gerold at [facility] 09/2022 regarding her anal fistula and it was decides to monitor her symptoms and Seton placement was deferred. Her last Humira injection was on Friday 03/06/2023. No longer taking oral Mesalamine. Fecal calprotectin level 214 on 01/22/2023. Her most recent colonoscopy 07/2022 showed her Crohn's disease was in clinical remission and a superficial perianal fistula was stable.      Latest Ref Rng & Units 01/20/2023   12:44 PM 09/09/2022   10:30 AM 05/09/2022    3:09 PM  CBC  WBC 4.0 - 10.5 K/uL 7.2  7.4  7.7   Hemoglobin 12.0 - 15.0 g/dL 40.9  81.1  91.4   Hematocrit 36.0 - 46.0 % 41.5  39.5  40.2   Platelets 150.0 - 400.0 K/uL 199.0  204.0  218.0        Latest Ref Rng & Units 01/20/2023   12:44 PM 03/12/2022    9:26 AM 11/05/2021    9:30 AM  CMP  Glucose 70 - 99 mg/dL 782   99   BUN 6 - 23 mg/dL 11   8   Creatinine 9.56 - 1.20 mg/dL 2.13   0.86   Sodium 578 - 145 mEq/L 135   138   Potassium 3.5 - 5.1 mEq/L 3.6   4.0   Chloride 96 - 112 mEq/L 103   104   CO2 19 -  32 mEq/L 25   24   Calcium 8.4 - 10.5 mg/dL 8.9   9.2   Total Protein 6.0 - 8.3 g/dL 7.6  7.5  7.4   Total Bilirubin 0.2 - 1.2 mg/dL 0.4  0.4  0.4   Alkaline Phos 39 - 117 U/L 38  53    AST 0 - 37 U/L 12  16  15    ALT 0 - 35 U/L 14  11  14       PAST GI PROCEDURES: Colonoscopy 09/19/20  -Inflammation throughout the entire colon except for the distal sigmoid and rectum.  Biopsies of the right colon showed ulcer with moderate active colitis, transverse colon with focal active colitis, left colon and rectum without significant pathologic findings.  The TI was normal.  The pathology report was consistent with IBD.  Colonoscopy 07/11/2022: - Superficial perianal fistula.  - The entire examined colon is normal. Biopsied.  - The examined portion of the ileum was normal. Biopsied.  - The examination was otherwise normal on direct and retroflexion views 1. Surgical [P], small bowel, terminal ileum BENIGN ILEAL MUCOSA WITH NO DIAGNOSTIC ABNORMALITY 2. Surgical [P], right colon bxs FOCAL ACTIVE COLITIS/CRYPTITIS NEGATIVE FOR DYSPLASIA AND  GRANULOMAS 3. Surgical [P], colon, transverse BENIGN COLONIC MUCOSA WITH NO DIAGNOSTIC ABNORMALITY 4. Surgical [P], left colon BENIGN COLONIC MUCOSA WITH NO DIAGNOSTIC ABNORMALITY 5. Surgical [P], colon, rectum BENIGN COLONIC MUCOSA WITH NO DIAGNOSTIC ABNORMALITY  IMAGE STUDIES: CT enterography 09/11/20 - fairly marked wall thickening to the distal and terminal ileum suggestive of active Crohn's disease  Current Outpatient Medications on File Prior to Visit  Medication Sig Dispense Refill   calcium carbonate (TITRALAC) 420 MG CHEW chewable tablet Chew 420 mg by mouth.     citalopram (CELEXA) 20 MG tablet Take 20 mg by mouth daily.     HUMIRA, 2 PEN, 40 MG/0.4ML pen INJECT 40 MG (0.4 ML) UNDER THE SKIN EVERY 14 DAYS 2 each 6   NONFORMULARY OR COMPOUNDED ITEM Pt taking neonatal vitamins     Omega-3 Fatty Acids (FISH OIL CONCENTRATE PO) Take by mouth.     OVER  THE COUNTER MEDICATION Probiotic gummy- one daily     clobetasol cream (TEMOVATE) 0.05 % Apply 1 Application topically 2 (two) times daily. Avoid applying to face, groin, and axilla. Use as directed. Long-term use can cause thinning of the skin. (Patient not taking: Reported on 09/09/2022) 30 g 0   ISOtretinoin (ACCUTANE) 30 MG capsule Take 1 capsule (30 mg total) by mouth daily. (Patient not taking: Reported on 09/09/2022) 30 capsule 0   LO LOESTRIN FE 1 MG-10 MCG / 10 MCG tablet Take 1 tablet by mouth daily. (Patient not taking: Reported on 09/09/2022)     mesalamine (LIALDA) 1.2 g EC tablet TAKE 2 TABLETS (2.4 G TOTAL) BY MOUTH DAILY WITH BREAKFAST. TAKE 2 TABLETS DAILY W/ BREAKFAST (Patient not taking: Reported on 07/11/2022) 180 tablet 1   mupirocin ointment (BACTROBAN) 2 % Apply 1 Application topically 2 (two) times daily. For lips until healed. (Patient not taking: Reported on 05/09/2022) 22 g 0   No current facility-administered medications on file prior to visit.   Allergies  Allergen Reactions   Crab (Diagnostic)     Swelling of eyes, she thinks it was a seafood mix   Current Medications, Allergies, Past Medical History, Past Surgical History, Family History and Social History were reviewed in [employer] record.  Review of Systems:   Constitutional: Negative for fever, sweats, chills or weight loss.  Respiratory: Negative for shortness of breath.   Cardiovascular: Negative for chest pain, palpitations and leg swelling.  Gastrointestinal: See HPI.  Musculoskeletal: Negative for back pain or muscle aches.  Neurological: Negative for dizziness, headaches or paresthesias.   Physical Exam: BP 110/72   Pulse 73   Ht 5\' 5"  (1.651 m)   Wt 137 lb 6.4 oz (62.3 kg)   BMI 22.86 kg/m  General: 32 year old female in no acute distress. Head: Normocephalic and atraumatic. Eyes: No scleral icterus. Conjunctiva pink . Ears: Normal auditory acuity. Mouth: Dentition intact. No  ulcers or lesions.  Lungs: Clear throughout to auscultation. Heart: Regular rate and rhythm, no murmur. Abdomen: Soft, nontender and nondistended. No masses or hepatomegaly. Normal bowel sounds x 4 quadrants.  Rectal: Posterior anal fissure open with a scant amount of brown drainage without obvious open external fistula at this time. Thick rubbery like sentinel tag to the anterior anal area without an obvious open fissure. DD CMA present during exam. Musculoskeletal: Symmetrical with no gross deformities. Extremities: No edema. Neurological: Alert oriented x 4. No focal deficits.  Psychological: Alert and cooperative. 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 Patient with a scant amount of chronic brown fistula drainage and open posterior anal fissure. Evaluated by colorectal surgeon 09/2022 with plans to monitor fistula output and Seton placement and fistulotomy not required. Colonoscopy 07/2022 showed clinical remission on Humira. Patient endorses having a little rectal bleeding and abdominal cramping x 3 weeks.  -Fecal calprotectin level -Check Adalimumab drug and antibody level 1 to 2 days prior to next Humira injection  -Recommend annual flu shot -Received Prevnar vaccination 1 or 2 years ago -Avoid NSAIDS

## 2023-03-11 NOTE — Patient Instructions (Addendum)
Return to our lab on  10/16 or 10/17 to complete your Humira drug and antibody level. Press "B" on the elevator. The lab is located at the first door on the left as you exit the elevator.  Contact our office if your rectal drainage or rectal bleeding increases.  Due to recent changes in healthcare laws, you may see the results of your imaging and laboratory studies on MyChart before your provider has had a chance to review them.  We understand that in some cases there may be results that are confusing or concerning to you. Not all laboratory results come back in the same time frame and the provider may be waiting for multiple results in order to interpret others.  Please give [place] 48 hours in order for your provider to thoroughly review all the results before contacting the office for clarification of your results.   Thank you for trusting me with your gastrointestinal care!   Alcide Evener, CRNP

## 2023-03-12 NOTE — Progress Notes (Signed)
I agree with the assessment and plan as outlined by Ms. Riley Kill. Patient's perianal colonic Crohn's disease appears to be well controlled from a luminal standpoint. Her CRP and CBC/CMP appear appropriate. Will see what her fecal calprotectin and Humira level and antibody show. If Humira level is low or patient is starting to develop antibodies again Humira, would recommend starting the patient on azathioprine 50 mg every day. Patient's last TPMT enzyme activity level was normal, suggesting she is a reasonable candidate for azathioprine therapy (would have counsel the patient on slightly increased risk of lab abnormalities, pancreatitis, and lymphoma). Azathioprine will boost the patient's Humira level and reduce the likelihood of antibody development. Patient continues to have drainage from fistula/fissure. Dr. Stevphen Rochester with [facility] who she saw previously is an IBD gastroenterologist, not a colorectal surgeon. If the patient is interested in potentially getting the fistula/fissure surgically repaired, we could refer her to [facility] colorectal surgery. However, it appears from prior notes that she has not been interested in a referral since she has been clinically doing well.

## 2023-03-13 ENCOUNTER — Other Ambulatory Visit: Payer: BC Managed Care – PPO

## 2023-03-13 DIAGNOSIS — K50119 Crohn's disease of large intestine with unspecified complications: Secondary | ICD-10-CM | POA: Diagnosis not present

## 2023-03-18 ENCOUNTER — Other Ambulatory Visit: Payer: BC Managed Care – PPO

## 2023-03-18 DIAGNOSIS — K50119 Crohn's disease of large intestine with unspecified complications: Secondary | ICD-10-CM

## 2023-03-18 LAB — CALPROTECTIN, FECAL: Calprotectin, Fecal: 288 ug/g — ABNORMAL HIGH (ref 0–120)

## 2023-03-20 DIAGNOSIS — Z6822 Body mass index (BMI) 22.0-22.9, adult: Secondary | ICD-10-CM | POA: Diagnosis not present

## 2023-03-20 DIAGNOSIS — Z01419 Encounter for gynecological examination (general) (routine) without abnormal findings: Secondary | ICD-10-CM | POA: Diagnosis not present

## 2023-03-30 LAB — SERIAL MONITORING

## 2023-03-31 LAB — ADALIMUMAB+AB (SERIAL MONITOR): Adalimumab Drug Level: 9.5 ug/mL

## 2023-04-01 NOTE — Telephone Encounter (Signed)
Dr. Leonides Schanz, I forwarded her Humira drug and antibody results to you earlier this morning. Since then, patient has sent this updated message. Let me know if your recommendations regarding increasing Humira or changing tx. THX.

## 2023-04-07 NOTE — Telephone Encounter (Signed)
Melissa Nicholson, pls contact patient and pls inquire further about why she thinks she might have pancreatitis. Does she have any N/V, upper abdominal/LUQ pain?    Pls let her know that Dr. Leonides Schanz further reviewed her updated messages regarding her Crohn's treamtment which indicated the patient prefers to increase Humira 40mg  SQ injections to once week and she does not wish to try Azathioprine.   Pls send in Humira 40mg   injection once weekly # 4, 1 refills.   Schedule patient for a follow up appointment with Dr. Leonides Schanz in 6 weeks (prior patient of Dr. Orvan Falconer). THX.

## 2023-04-08 ENCOUNTER — Other Ambulatory Visit: Payer: Self-pay

## 2023-04-08 DIAGNOSIS — K50119 Crohn's disease of large intestine with unspecified complications: Secondary | ICD-10-CM

## 2023-04-08 DIAGNOSIS — K602 Anal fissure, unspecified: Secondary | ICD-10-CM

## 2023-04-08 MED ORDER — HUMIRA (2 PEN) 40 MG/0.4ML ~~LOC~~ AJKT
40.0000 mg | AUTO-INJECTOR | SUBCUTANEOUS | 1 refills | Status: DC
Start: 2023-04-08 — End: 2023-07-13

## 2023-04-08 NOTE — Telephone Encounter (Signed)
Pt made aware of Alcide Evener NP and Dr. Leonides Schanz recommendations: Pt stated that she does not have any nausea or vomiting or RUQ pain. Pt stated that she just had some abdominal tenderness. Prescription was sent to pharmacy for weekly injections:Pt made aware. Pt scheduled to see Dr. Leonides Schanz on 06/05/2023 at 11:10 AM Pt made aware  Please assist with PA if needed

## 2023-04-15 NOTE — Telephone Encounter (Signed)
Pt states [facility] denied her new script, may need PA.

## 2023-04-17 ENCOUNTER — Telehealth: Payer: Self-pay | Admitting: Nurse Practitioner

## 2023-04-17 NOTE — Telephone Encounter (Signed)
Spoke to patient & made her aware that forms have been faxed to [facility]. Advised her that if she has not heard from insurance/pharmacy approval on medication then to call us next week. She's aware to contact Viviann Spare, RN since I will be out of office early part of the week.

## 2023-04-17 NOTE — Telephone Encounter (Signed)
Patient called stated she sent a MyChart message on 04/01/23 and has not heard from anyone. She said she is completely out of the Humira and she was suppose to take it yesterday. Please advise.

## 2023-04-17 NOTE — Telephone Encounter (Signed)
Fax received & filled out. Jill Side is currently out of office, placed form that requires provider signature on Dr. Derek Mound desk for review.

## 2023-04-17 NOTE — Telephone Encounter (Signed)
Still have not received fax from [facility]. Called [employer] again & requested forms be faxed again and to also send it to [phone].

## 2023-04-17 NOTE — Telephone Encounter (Signed)
Spoke with patient & she stated she received her Humira refill on 03/27/23 and it only had two pens. She took a dose on 04/02/23 & 04/09/23 (since provider moved her to weekly dosing). I spoke with two reps from [facility]. New order was placed 04/08/23, and insurance will not allow for a refill this soon & prior Berkley Harvey is needed. They are going to fax forms to [phone] that will need to be filled out & faxed back to them for review before patient can receive injections.

## 2023-04-20 NOTE — Telephone Encounter (Signed)
PT returning call. She has not heard anything from the pharmacy over the weekend and is very concerned. Please advise.

## 2023-04-20 NOTE — Telephone Encounter (Signed)
Spoke with pt. Pt state dthat she has not heard anything from the pharmacy. Pharmacy was called and spoke with Lawson Fiscal. Lawson Fiscal stated that it was in transit to be delivered tomorrow.  Pt made aware.  Pt verbalized understanding with all questions answered.

## 2023-04-22 NOTE — Telephone Encounter (Signed)
Pt stated that she only received 2 pens instead of the 4. I contacted the pt pharmacy and was notified after a test claim was ran on the previous prescription that was placed for the Humira to be given every 7 days that insurance has denied this.  Please review if a PA is needed for this

## 2023-04-22 NOTE — Telephone Encounter (Signed)
Pt was notified that the pharmacy stated that her insurance has denied this. Pt was encouraged to reach out to her insurance company. Pt notified to call us back with any additional information.

## 2023-04-22 NOTE — Telephone Encounter (Signed)
Inbound call from patient, states she only received two Humira pens instead of the four from the new prescription.  Patient states she will not have enough to get through the month.

## 2023-04-23 ENCOUNTER — Ambulatory Visit: Payer: BC Managed Care – PPO | Admitting: Dermatology

## 2023-04-23 ENCOUNTER — Telehealth: Payer: Self-pay | Admitting: Pharmacy Technician

## 2023-04-23 ENCOUNTER — Other Ambulatory Visit (HOSPITAL_COMMUNITY): Payer: Self-pay

## 2023-04-23 DIAGNOSIS — D492 Neoplasm of unspecified behavior of bone, soft tissue, and skin: Secondary | ICD-10-CM | POA: Diagnosis not present

## 2023-04-23 DIAGNOSIS — L308 Other specified dermatitis: Secondary | ICD-10-CM

## 2023-04-23 NOTE — Progress Notes (Signed)
   Follow-Up Visit   Subjective  Melissa Nicholson is a 32 y.o. female who presents for the following: hx of tick bite 10/2022, tick was on her ~1 day, recently started itching ~2 wks ago, no treatment  The patient has spots, moles and lesions to be evaluated, some may be new or changing and the patient may have concern these could be cancer.   The following portions of the chart were reviewed this encounter and updated as appropriate: medications, allergies, medical history  Review of Systems:  No other skin or systemic complaints except as noted in HPI or Assessment and Plan.  Objective  Well appearing patient in no apparent distress; mood and affect are within normal limits.   A focused examination was performed of the following areas: abdomen  Relevant exam findings are noted in the Assessment and Plan.  L abdomen 4.14mm pink pap with sq induration       Assessment & Plan     Neoplasm of skin L abdomen  Skin / nail biopsy Type of biopsy: punch   Informed consent: discussed and consent obtained   Timeout: patient name, date of birth, surgical site, and procedure verified   Procedure prep:  Patient was prepped and draped in usual sterile fashion Prep type:  Isopropyl alcohol Anesthesia: the lesion was anesthetized in a standard fashion   Anesthetic:  1% lidocaine w/ epinephrine 1-100,000 buffered w/ 8.4% NaHCO3 Punch size:  6 mm Suture size:  5-0 Suture type: Vicryl (polyglactin 910)   Suture type comment:  5-0 gut suture Hemostasis achieved with: suture, pressure, aluminum chloride and electrodesiccation   Outcome: patient tolerated procedure well   Post-procedure details: sterile dressing applied and wound care instructions given   Dressing type: bacitracin and pressure dressing   Additional details:  Closure with vertical mattress x 2 with 5-0 vicryl and simple interrupted x 3 with plain gut  Specimen 1 - Surgical pathology Differential Diagnosis: D48.5  Scar vs tick mouth parts   Check Margins: No 4.20mm pink pap with sq induration    Return if symptoms worsen or fail to improve.  I, Ardis Rowan, RMA, am acting as scribe for Elie Goody, MD .   Documentation: I have reviewed the above documentation for accuracy and completeness, and I agree with the above.  Elie Goody, MD

## 2023-04-23 NOTE — Telephone Encounter (Signed)
PA has been submitted, and telephone encounter has been created. 

## 2023-04-23 NOTE — Patient Instructions (Addendum)

## 2023-04-23 NOTE — Telephone Encounter (Signed)
Pharmacy Patient Advocate Encounter   Received notification from Patient Advice Request messages that prior authorization for HUMIRA 40MG  is required/requested.   Insurance verification completed.   The patient is insured through [facility] .   Per test claim: PA required; PA submitted to above mentioned insurance via CoverMyMeds Key/confirmation #/EOC [id number] Status is pending

## 2023-04-24 ENCOUNTER — Other Ambulatory Visit (HOSPITAL_COMMUNITY): Payer: Self-pay

## 2023-04-24 NOTE — Telephone Encounter (Signed)
Left message for pt to call back  °

## 2023-04-24 NOTE — Telephone Encounter (Signed)
Pt stated that she spoke with her insurance company who is working with [facility] to get the medication approved. Pt stated that she will back on Monday with an updated status after the speaks with her insurance company and [facility].  Pt verbalized understanding with all questions answered.  See other phone note also

## 2023-04-24 NOTE — Telephone Encounter (Signed)
Pharmacy Patient Advocate Encounter  Received notification from [facility] that Prior Authorization for HUMIRA 40MG  has been CANCELLED due to  A prior authorization is already on file for this patient. I called [employer] (spoke with [place] B)and the verified the PA is effective 11.18.24 for 4 pens per 28 days. However the dilemma is the medication was filled before the PA became active. She said [facility] would need to call them to get the override to fill the other two pens. Called [facility] (spoke with Emilee Hero) and informed him of the steps that needed to be taken, and that if any issues to call [employer] to get assistance in putting in the override.

## 2023-04-26 ENCOUNTER — Encounter: Payer: Self-pay | Admitting: Dermatology

## 2023-04-27 NOTE — Telephone Encounter (Signed)
Left message for pt to call back  °

## 2023-04-28 ENCOUNTER — Other Ambulatory Visit: Payer: Self-pay

## 2023-04-28 DIAGNOSIS — K50119 Crohn's disease of large intestine with unspecified complications: Secondary | ICD-10-CM

## 2023-04-28 NOTE — Telephone Encounter (Signed)
Pt stated that she did speak with [facility] and they stated that the have they are going to fill the new prescription.   Pt questioned if she could possibly do another fecal calprotection stool test. Pt stated that she had not been waiting the full 30 minutes after taking the medication out of the refrigerator but typically only 10 minutes. Pt questions if this could have made an impact and wants to ensure that this may not have been some of her issues. Please review and advise

## 2023-04-28 NOTE — Telephone Encounter (Signed)
Pt stated that she spoke with [facility] and they stated that they are going to fill the new prescription. See other phone note for additional documentation:

## 2023-04-28 NOTE — Telephone Encounter (Signed)
Pt made aware of Alcide Evener NP recommendations. Order for lab placed in Epic. Pt made aware. Pt verbalized understanding with all questions answered.

## 2023-04-28 NOTE — Telephone Encounter (Signed)
Melissa Nicholson, if patient feels she needs the reassurance of repeating a fecal calprotectin level, ok to order another one at this time. Thanks.

## 2023-04-29 ENCOUNTER — Other Ambulatory Visit: Payer: Self-pay

## 2023-05-01 LAB — SURGICAL PATHOLOGY

## 2023-05-04 ENCOUNTER — Encounter: Payer: Self-pay | Admitting: Dermatology

## 2023-05-07 ENCOUNTER — Other Ambulatory Visit: Payer: BC Managed Care – PPO

## 2023-05-07 DIAGNOSIS — K50119 Crohn's disease of large intestine with unspecified complications: Secondary | ICD-10-CM

## 2023-05-10 LAB — CALPROTECTIN, FECAL: Calprotectin, Fecal: 39 ug/g (ref 0–120)

## 2023-05-15 NOTE — Telephone Encounter (Signed)
Dr. Leonides Schanz, [place] Childes, see patient message below. Patient did not increase Humira to once weekly, she remains on Humira Q 2 weeks. She has an appt to see you on 06/04/2022.

## 2023-05-19 ENCOUNTER — Ambulatory Visit (INDEPENDENT_AMBULATORY_CARE_PROVIDER_SITE_OTHER): Payer: BC Managed Care – PPO | Admitting: Dermatology

## 2023-05-19 ENCOUNTER — Encounter: Payer: Self-pay | Admitting: Dermatology

## 2023-05-19 DIAGNOSIS — Z5189 Encounter for other specified aftercare: Secondary | ICD-10-CM

## 2023-05-19 DIAGNOSIS — R21 Rash and other nonspecific skin eruption: Secondary | ICD-10-CM

## 2023-05-19 DIAGNOSIS — S30861A Insect bite (nonvenomous) of abdominal wall, initial encounter: Secondary | ICD-10-CM

## 2023-05-19 DIAGNOSIS — W57XXXA Bitten or stung by nonvenomous insect and other nonvenomous arthropods, initial encounter: Secondary | ICD-10-CM

## 2023-05-19 MED ORDER — DOXYCYCLINE MONOHYDRATE 100 MG PO CAPS: 100.0000 mg | ORAL_CAPSULE | Freq: Two times a day (BID) | ORAL | 0 refills | Status: AC

## 2023-05-19 NOTE — Progress Notes (Signed)
   Follow-Up Visit   Subjective  Melissa Nicholson is a 32 y.o. female who presents for the following: wound check. Hx of inflamed tick bite, patient advises it became tender over the weekend and had a white spot. When she squeezed it, pus came out. Patient wants to make sure it is not infected.  The patient has spots, moles and lesions to be evaluated, some may be new or changing and the patient may have concern these could be cancer.   The following portions of the chart were reviewed this encounter and updated as appropriate: medications, allergies, medical history  Review of Systems:  No other skin or systemic complaints except as noted in HPI or Assessment and Plan.  Objective  Well appearing patient in no apparent distress; mood and affect are within normal limits.    A focused examination was performed of the following areas: abdomen  Relevant exam findings are noted in the Assessment and Plan.  Left Abdomen (side) Indurated papule with central excision wound. Purulent drainage expressed. Improvement in surrounding erythema relative to photo  Assessment & Plan     RASH AND OTHER NONSPECIFIC SKIN ERUPTION   Related Procedures Anaerobic and Aerobic Culture REACTION TO INSECT BITE Left Abdomen (side) Start doxycycline monohydrate 100 mg bid x 1 week.   Doxycycline should be taken with food to prevent nausea. Do not lay down for 30 minutes after taking. Be cautious with sun exposure and use good sun protection while on this medication. Pregnant women should not take this medication.  VISIT FOR WOUND CHECK    Return if symptoms worsen or fail to improve.  Anise Salvo, RMA, am acting as scribe for Elie Goody, MD .   Documentation: I have reviewed the above documentation for accuracy and completeness, and I agree with the above.  Elie Goody, MD

## 2023-05-19 NOTE — Patient Instructions (Signed)
Doxycycline should be taken with food to prevent nausea. Do not lay down for 30 minutes after taking. Be cautious with sun exposure and use good sun protection while on this medication. Pregnant women should not take this medication.   Due to recent changes in healthcare laws, you may see results of your pathology and/or laboratory studies on MyChart before the doctors have had a chance to review them. We understand that in some cases there may be results that are confusing or concerning to you. Please understand that not all results are received at the same time and often the doctors may need to interpret multiple results in order to provide you with the best plan of care or course of treatment. Therefore, we ask that you please give [place] 2 business days to thoroughly review all your results before contacting the office for clarification. Should we see a critical lab result, you will be contacted sooner.   If You Need Anything After Your Visit  If you have any questions or concerns for your doctor, please call our main line at [phone] and press option 4 to reach your doctor's medical assistant. If no one answers, please leave a voicemail as directed and we will return your call as soon as possible. Messages left after 4 pm will be answered the following business day.   You may also send [place] a message via MyChart. We typically respond to MyChart messages within 1-2 business days.  For prescription refills, please ask your pharmacy to contact our office. Our fax number is [phone].  If you have an urgent issue when the clinic is closed that cannot wait until the next business day, you can page your doctor at the number below.    Please note that while we do our best to be available for urgent issues outside of office hours, we are not available 24/7.   If you have an urgent issue and are unable to reach [place], you may choose to seek medical care at your doctor's office, retail clinic, urgent care  center, or emergency room.  If you have a medical emergency, please immediately call 911 or go to the emergency department.  Pager Numbers  - Dr. Gwen Pounds: [phone]  - Dr. Roseanne Reno: [phone]  - Dr. Katrinka Blazing: [phone]   In the event of inclement weather, please call our main line at [phone] for an update on the status of any delays or closures.  Dermatology Medication Tips: Please keep the boxes that topical medications come in in order to help keep track of the instructions about where and how to use these. Pharmacies typically print the medication instructions only on the boxes and not directly on the medication tubes.   If your medication is too expensive, please contact our office at [phone] option 4 or send [place] a message through MyChart.   We are unable to tell what your co-pay for medications will be in advance as this is different depending on your insurance coverage. However, we may be able to find a substitute medication at lower cost or fill out paperwork to get insurance to cover a needed medication.   If a prior authorization is required to get your medication covered by your insurance company, please allow [place] 1-2 business days to complete this process.  Drug prices often vary depending on where the prescription is filled and some pharmacies may offer cheaper prices.  The website www.goodrx.com contains coupons for medications through different pharmacies. The prices here do not account for what  the cost may be with help from insurance (it may be cheaper with your insurance), but the website can give you the price if you did not use any insurance.  - You can print the associated coupon and take it with your prescription to the pharmacy.  - You may also stop by our office during regular business hours and pick up a GoodRx coupon card.  - If you need your prescription sent electronically to a different pharmacy, notify our office through [facility] or by  phone at [phone] option 4.     Si Usted Necesita Algo Despus de Su Visita  Tambin puede enviarnos un mensaje a travs de Clinical cytogeneticist. Por lo general respondemos a los mensajes de MyChart en el transcurso de 1 a 2 das hbiles.  Para renovar recetas, por favor pida a su farmacia que se ponga en contacto con nuestra oficina. Annie Sable de fax es [place] [phone].  Si tiene un asunto urgente cuando la clnica est cerrada y que no puede esperar hasta el siguiente da hbil, puede llamar/localizar a su doctor(a) al nmero que aparece a continuacin.   Por favor, tenga en cuenta que aunque hacemos todo lo posible para estar disponibles para asuntos urgentes fuera del horario de [place], no estamos disponibles las 24 horas del da, los 7 [address]  [address] de la [place].   Si tiene un problema urgente y no puede comunicarse con nosotros, puede optar por buscar atencin mdica  en el consultorio de su doctor(a), en una clnica privada, en un centro de atencin urgente o en una sala de emergencias.  Si tiene Engineer, drilling, por favor llame inmediatamente al 911 o vaya a la sala de emergencias.  Nmeros de bper  - Dr. Gwen Pounds: [phone]  - Dra. Roseanne Reno: [phone]  - Dr. Katrinka Blazing: [phone]   En caso de inclemencias del tiempo, por favor llame a Lacy Duverney principal al [phone] para una actualizacin sobre el [place] de cualquier retraso o cierre.  Consejos para la medicacin en dermatologa: Por favor, guarde las cajas en las que vienen los medicamentos de uso tpico para ayudarle a seguir las instrucciones sobre dnde y cmo usarlos. Las farmacias generalmente imprimen las instrucciones del medicamento slo en las cajas y no directamente en los tubos del [place].   Si su medicamento es muy caro, por favor, pngase en contacto con Rolm Gala llamando al [phone] y presione la opcin 4 o envenos un mensaje a travs de Clinical cytogeneticist.   No podemos decirle cul ser su copago  por los medicamentos por adelantado ya que esto es diferente dependiendo de la cobertura de su seguro. Sin embargo, es posible que podamos encontrar un medicamento sustituto a Audiological scientist un formulario para que el seguro cubra el medicamento que se considera necesario.   Si se requiere una autorizacin previa para que su compaa de seguros [place] su medicamento, por favor permtanos de 1 a 2 das hbiles para completar [address]  Los precios de los medicamentos varan con frecuencia dependiendo del Environmental consultant de dnde se surte la receta y alguna farmacias pueden ofrecer precios ms baratos.  El sitio web www.goodrx.com tiene cupones para medicamentos de Health and safety inspector. Los precios aqu no tienen en cuenta lo que podra costar con la ayuda del seguro (puede ser ms barato con su seguro), pero el sitio web puede darle el precio si no utiliz Tourist information centre manager.  - Puede imprimir el cupn correspondiente y llevarlo con su receta a la farmacia.  - Tambin puede  pasar por nuestra oficina durante el horario de atencin regular y Education officer, museum una tarjeta de cupones de GoodRx.  - Si necesita que su receta se enve electrnicamente a una farmacia diferente, informe a nuestra oficina a travs de MyChart de [place] o por telfono llamando al [phone] y presione la opcin 4.

## 2023-05-23 LAB — ANAEROBIC AND AEROBIC CULTURE

## 2023-05-30 ENCOUNTER — Encounter (HOSPITAL_BASED_OUTPATIENT_CLINIC_OR_DEPARTMENT_OTHER): Payer: Self-pay | Admitting: Emergency Medicine

## 2023-05-30 ENCOUNTER — Other Ambulatory Visit: Payer: Self-pay

## 2023-05-30 ENCOUNTER — Other Ambulatory Visit (HOSPITAL_BASED_OUTPATIENT_CLINIC_OR_DEPARTMENT_OTHER): Payer: Self-pay

## 2023-05-30 ENCOUNTER — Emergency Department (HOSPITAL_BASED_OUTPATIENT_CLINIC_OR_DEPARTMENT_OTHER): Payer: BC Managed Care – PPO

## 2023-05-30 ENCOUNTER — Emergency Department (HOSPITAL_BASED_OUTPATIENT_CLINIC_OR_DEPARTMENT_OTHER)
Admission: EM | Admit: 2023-05-30 | Discharge: 2023-05-30 | Disposition: A | Discharge status: Home / Self Care | Payer: BC Managed Care – PPO

## 2023-05-30 ENCOUNTER — Emergency Department (HOSPITAL_BASED_OUTPATIENT_CLINIC_OR_DEPARTMENT_OTHER): Payer: BC Managed Care – PPO | Admitting: Radiology

## 2023-05-30 DIAGNOSIS — R079 Chest pain, unspecified: Secondary | ICD-10-CM | POA: Diagnosis not present

## 2023-05-30 DIAGNOSIS — R0789 Other chest pain: Secondary | ICD-10-CM | POA: Insufficient documentation

## 2023-05-30 DIAGNOSIS — R7989 Other specified abnormal findings of blood chemistry: Secondary | ICD-10-CM | POA: Diagnosis not present

## 2023-05-30 DIAGNOSIS — M542 Cervicalgia: Secondary | ICD-10-CM | POA: Diagnosis not present

## 2023-05-30 LAB — CBC
HCT: 43.7 % (ref 36.0–46.0)
Hemoglobin: 14.5 g/dL (ref 12.0–15.0)
MCH: 30.5 pg (ref 26.0–34.0)
MCHC: 33.2 g/dL (ref 30.0–36.0)
MCV: 92 fL (ref 80.0–100.0)
Platelets: 199 10*3/uL (ref 150–400)
RBC: 4.75 MIL/uL (ref 3.87–5.11)
RDW: 12.1 % (ref 11.5–15.5)
WBC: 7.8 10*3/uL (ref 4.0–10.5)
nRBC: 0 % (ref 0.0–0.2)

## 2023-05-30 LAB — BASIC METABOLIC PANEL
Anion gap: 13 (ref 5–15)
BUN: 12 mg/dL (ref 6–20)
CO2: 24 mmol/L (ref 22–32)
Calcium: 9.4 mg/dL (ref 8.9–10.3)
Chloride: 101 mmol/L (ref 98–111)
Creatinine, Ser: 0.87 mg/dL (ref 0.44–1.00)
GFR, Estimated: >60 mL/min (ref >60)
Glucose, Bld: 107 mg/dL — ABNORMAL HIGH (ref 70–99)
Potassium: 3.9 mmol/L (ref 3.5–5.1)
Sodium: 138 mmol/L (ref 135–145)

## 2023-05-30 LAB — TROPONIN I (HIGH SENSITIVITY)
Troponin I (High Sensitivity): 3 ng/L (ref <18)
Troponin I (High Sensitivity): 3 ng/L (ref <18)

## 2023-05-30 LAB — PREGNANCY, URINE: Preg Test, Ur: NEGATIVE

## 2023-05-30 LAB — D-DIMER, QUANTITATIVE: D-Dimer, Quant: 0.51 ug{FEU}/mL — ABNORMAL HIGH (ref 0.00–0.50)

## 2023-05-30 MED ORDER — IOHEXOL 350 MG/ML SOLN
75.0000 mL | Freq: Once | INTRAVENOUS | Status: AC | PRN
  Administered 2023-05-30: 75 mL via INTRAVENOUS

## 2023-05-30 MED ORDER — FAMOTIDINE 20 MG PO TABS
20.0000 mg | ORAL_TABLET | Freq: Once | ORAL | Status: AC
  Administered 2023-05-30: 20 mg via ORAL
  Filled 2023-05-30: qty 1

## 2023-05-30 MED ORDER — ACETAMINOPHEN 500 MG PO TABS
1000.0000 mg | ORAL_TABLET | Freq: Once | ORAL | Status: AC
  Administered 2023-05-30: 1000 mg via ORAL
  Filled 2023-05-30: qty 2

## 2023-05-30 MED ORDER — PANTOPRAZOLE SODIUM 20 MG PO TBEC
20.0000 mg | DELAYED_RELEASE_TABLET | Freq: Every day | ORAL | 0 refills | Status: DC
  Filled 2023-05-30: qty 30, 30d supply, fill #0

## 2023-05-30 MED ORDER — PANTOPRAZOLE SODIUM 20 MG PO TBEC: 20.0000 mg | DELAYED_RELEASE_TABLET | Freq: Every day | ORAL | 0 refills | Status: AC

## 2023-05-30 NOTE — ED Triage Notes (Signed)
On Thursday started with neck/back pain (around shoulders), noticed it now has started hurting mid chest when she turns her head to the side. Intermittent in nature.

## 2023-05-30 NOTE — Discharge Instructions (Addendum)
Evaluation for your chest pain was overall reassuring.  You do not have a clot in your lungs.  Suspect some your symptoms could be related to reflux.  I am sending Protonix to your pharmacy.  Either way would recommend you follow-up your PCP.  If you have worsening chest pain, shortness of breath, calf tenderness or swelling or any other concerning symptom please return emergency department further evaluation.

## 2023-05-30 NOTE — ED Notes (Signed)
Reviewed discharge instructions, medications, and home care with pt. Pt verbalized understanding and had no further questions. Pt exited ED without complications.

## 2023-05-30 NOTE — ED Provider Notes (Signed)
[place] EMERGENCY DEPARTMENT AT [facility] Provider Note   CSN: [id number] Arrival date & time: 05/30/23  1221     History  No chief complaint on file.  HPI Melissa Nicholson is a 32 y.o. female with history of Crohn's disease presenting for chest pain.  States on Thursday she started to have pain in the back of her neck and upper back and then noticed about 2 days ago she started to have pain in the center of her chest as well.  Denies any trauma.  Denies associated shortness of breath.  The pain does feel sharp at times but is nonexertional and nonpleuritic.  States not necessarily worse after meals and mostly worse in the morning when she wakes up.  Does report OCP use, denies recent immobilization and calf tenderness and swelling.  HPI     Home Medications Prior to Admission medications   Medication Sig Start Date End Date Taking? Authorizing Provider  pantoprazole (PROTONIX) 20 MG tablet Take 1 tablet (20 mg total) by mouth daily. 05/30/23  Yes Riki Sheer K, PA-C  adalimumab (HUMIRA, 2 PEN,) 40 MG/0.4ML pen Inject 0.4 mLs (40 mg total) into the skin every 7 (seven) days. 04/08/23   Arnaldo Natal, NP  calcium carbonate (TITRALAC) 420 MG CHEW chewable tablet Chew 420 mg by mouth.    [provider]  citalopram (CELEXA) 20 MG tablet Take 20 mg by mouth daily.    [provider]  clobetasol cream (TEMOVATE) 0.05 % Apply 1 Application topically 2 (two) times daily. Avoid applying to face, groin, and axilla. Use as directed. Long-term use can cause thinning of the skin. Patient not taking: Reported on 09/09/2022 04/16/22   Neale Burly, [place], MD  ISOtretinoin (ACCUTANE) 30 MG capsule Take 1 capsule (30 mg total) by mouth daily. Patient not taking: Reported on 09/09/2022 07/09/22   Moye, [place], MD  LO LOESTRIN FE 1 MG-10 MCG / 10 MCG tablet Take 1 tablet by mouth daily. Patient not taking: Reported on 09/09/2022 08/19/20   [provider]   mesalamine (LIALDA) 1.2 g EC tablet TAKE 2 TABLETS (2.4 G TOTAL) BY MOUTH DAILY WITH BREAKFAST. TAKE 2 TABLETS DAILY W/ BREAKFAST Patient not taking: Reported on 07/11/2022 05/27/22   Arnaldo Natal, NP  mupirocin ointment (BACTROBAN) 2 % Apply 1 Application topically 2 (two) times daily. For lips until healed. Patient not taking: Reported on 05/09/2022 03/12/22   Sandi Mealy, MD  NONFORMULARY OR COMPOUNDED ITEM Pt taking neonatal vitamins    [provider]  Omega-3 Fatty Acids (FISH OIL CONCENTRATE PO) Take by mouth.    [provider]  OVER THE COUNTER MEDICATION Probiotic gummy- one daily    [provider]      Allergies    Crab (diagnostic)    Review of Systems   See HPI for pertinent positives  Physical Exam Updated Vital Signs BP 114/84 (BP Location: Right Arm)   Pulse 77   Temp 97.8 F (36.6 C) (Oral)   Resp 20   Wt 60.3 kg   SpO2 100%   BMI 22.13 kg/m  Physical Exam Vitals and nursing note reviewed.  HENT:     Head: Normocephalic and atraumatic.     Mouth/Throat:     Mouth: Mucous membranes are moist.  Eyes:     General:        Right eye: No discharge.        Left eye: No discharge.     Conjunctiva/sclera: Conjunctivae  normal.  Cardiovascular:     Rate and Rhythm: Normal rate and regular rhythm.     Pulses: Normal pulses.     Heart sounds: Normal heart sounds.  Pulmonary:     Effort: Pulmonary effort is normal.     Breath sounds: Normal breath sounds.  Abdominal:     General: Abdomen is flat.     Palpations: Abdomen is soft.  Musculoskeletal:     Right lower leg: Normal. No swelling. No edema.     Left lower leg: Normal. No swelling. No edema.     Right ankle: Normal pulse.     Left ankle: Normal pulse.  Skin:    General: Skin is warm and dry.  Neurological:     General: No focal deficit present.  Psychiatric:        Mood and Affect: Mood normal.     ED Results / Procedures / Treatments   Labs (all labs  ordered are listed, but only abnormal results are displayed) Labs Reviewed  BASIC METABOLIC PANEL - Abnormal; Notable for the following components:      Result Value   Glucose, Bld 107 (*)    All other components within normal limits  D-DIMER, QUANTITATIVE - Abnormal; Notable for the following components:   D-Dimer, Quant 0.51 (*)    All other components within normal limits  CBC  PREGNANCY, URINE  TROPONIN I (HIGH SENSITIVITY)  TROPONIN I (HIGH SENSITIVITY)    EKG None  Radiology CT Angio Chest PE W/Cm &/Or Wo Cm Result Date: 05/30/2023 CLINICAL DATA:  Pulmonary embolism (PE) suspected, low to intermediate prob, positive D-dimer. Neck and back pain. Mid chest pain. EXAM: CT ANGIOGRAPHY CHEST WITH CONTRAST TECHNIQUE: Multidetector CT imaging of the chest was performed using the standard protocol during bolus administration of intravenous contrast. Multiplanar CT image reconstructions and MIPs were obtained to evaluate the vascular anatomy. RADIATION DOSE REDUCTION: This exam was performed according to the departmental dose-optimization program which includes automated exposure control, adjustment of the mA and/or kV according to patient size and/or use of iterative reconstruction technique. CONTRAST:  75mL OMNIPAQUE IOHEXOL 350 MG/ML SOLN COMPARISON:  None Available. FINDINGS: Cardiovascular: No evidence of embolism to the proximal subsegmental pulmonary artery level. Normal cardiac size. No pericardial effusion. No aortic aneurysm. Mediastinum/Nodes: Visualized thyroid gland appears grossly unremarkable. No solid / cystic mediastinal masses. The esophagus is nondistended precluding optimal assessment. No axillary, mediastinal or hilar lymphadenopathy by size criteria. Lungs/Pleura: The central tracheo-bronchial tree is patent. No mass or consolidation. No pleural effusion or pneumothorax. No suspicious lung nodules. Upper Abdomen: Visualized upper abdominal viscera within normal limits.  Musculoskeletal: There are multiple nodular soft tissue attenuation areas in bilateral breasts which also contains small foci of dystrophic calcifications, left greater than right. These are incompletely characterized on the CT scan examination. Correlate clinically. The visualized soft tissues of the chest wall are otherwise grossly unremarkable. No suspicious osseous lesions. Review of the MIP images confirms the above findings. IMPRESSION: *No evidence of embolism to the proximal subsegmental pulmonary artery level. *Multiple other nonacute observations, as described above. Electronically Signed   By: Jules Schick M.D.   On: 05/30/2023 17:25   DG Chest 2 View Result Date: 05/30/2023 CLINICAL DATA:  Chest pain radiating to neck and back. EXAM: CHEST - 2 VIEW COMPARISON:  01/15/2021 FINDINGS: The heart size and mediastinal contours are within normal limits. Both lungs are clear. The visualized skeletal structures are unremarkable. IMPRESSION: Normal exam. Electronically Signed   By: Jonny Ruiz  Geanie Cooley M.D.   On: 05/30/2023 13:46    Procedures Procedures    Medications Ordered in ED Medications  acetaminophen (TYLENOL) tablet 1,000 mg (1,000 mg Oral Given 05/30/23 1423)  famotidine (PEPCID) tablet 20 mg (20 mg Oral Given 05/30/23 1423)  iohexol (OMNIPAQUE) 350 MG/ML injection 75 mL (75 mLs Intravenous Contrast Given 05/30/23 1559)    ED Course/ Medical Decision Making/ A&P                                 Medical Decision Making Amount and/or Complexity of Data Reviewed Labs: ordered. Radiology: ordered.  Risk OTC drugs. Prescription drug management.   Initial Impression and Ddx 32 year old well-appearing female present for chest pain.  Exam is unremarkable.  DDx includes ACS, PE, pneumothorax, pneumonia, reflux, other. Patient PMH that increases complexity of ED encounter:  history of Crohn's disease  Interpretation of Diagnostics I independent reviewed and interpreted the labs as  followed: elevated d dimer  - I independently visualized the following imaging with scope of interpretation limited to determining acute life threatening conditions related to emergency care: Chest CTA, which was negative for pulmonary embolism and no acute findings noted.  -I personally reviewed and interpreted EKG which revealed normal sinus rhythm and was nonischemic  Patient Reassessment and Ultimate Disposition/Management On reassessment, patient symptoms had improved.  Workup unremarkable. Had a low suspicion for PE but patient endorses OCP use and dimer was elevated with chest pain.  CT of the chest fortunately was negative for pulmonary embolism. I suspect reflux could be contributing in someway.  Sent Protonix to her pharmacy. Advised follow-up with PCP.  Respiratory precautions.  Vital stable.  Discharged in good condition.  Patient management required discussion with the following services or consulting groups:  None  Complexity of Problems Addressed Acute complicated illness or Injury  Additional Data Reviewed and Analyzed Further history obtained from: Past medical history and medications listed in the EMR and Prior ED visit notes  Patient Encounter Risk Assessment Prescriptions         Final Clinical Impression(s) / ED Diagnoses Final diagnoses:  Atypical chest pain    Rx / DC Orders ED Discharge Orders          Ordered    pantoprazole (PROTONIX) 20 MG tablet  Daily        05/30/23 1750              Gareth Eagle, PA-C 05/30/23 1751

## 2023-05-31 ENCOUNTER — Other Ambulatory Visit (HOSPITAL_BASED_OUTPATIENT_CLINIC_OR_DEPARTMENT_OTHER): Payer: Self-pay

## 2023-06-01 ENCOUNTER — Encounter: Payer: Self-pay | Admitting: Internal Medicine

## 2023-06-05 ENCOUNTER — Ambulatory Visit: Payer: BC Managed Care – PPO | Admitting: Internal Medicine

## 2023-06-05 ENCOUNTER — Encounter: Payer: Self-pay | Admitting: Internal Medicine

## 2023-06-05 VITALS — BP 110/70 | HR 72 | Ht 64.0 in | Wt 137.2 lb

## 2023-06-05 DIAGNOSIS — K50119 Crohn's disease of large intestine with unspecified complications: Secondary | ICD-10-CM

## 2023-06-05 DIAGNOSIS — K602 Anal fissure, unspecified: Secondary | ICD-10-CM

## 2023-06-05 DIAGNOSIS — K603 Anal fistula, unspecified: Secondary | ICD-10-CM

## 2023-06-05 NOTE — Patient Instructions (Addendum)
 Please come back in March for labs   Follow up in 6 months _______________________________________________________  If your blood pressure at your visit was 140/90 or greater, please contact your primary care physician to follow up on this.  _______________________________________________________  If you are age 35 or older, your body mass index should be between 23-30. Your Body mass index is 23.56 kg/m. If this is out of the aforementioned range listed, please consider follow up with your Primary Care Provider.  If you are age 32 or younger, your body mass index should be between 19-25. Your Body mass index is 23.56 kg/m. If this is out of the aformentioned range listed, please consider follow up with your Primary Care Provider.   ________________________________________________________  The [facility] providers would like to encourage you to use MYCHART to communicate with providers for non-urgent requests or questions.  Due to long hold times on the telephone, sending your provider a message by [facility] may be a faster and more efficient way to get a response.  Please allow 48 business hours for a response.  Please remember that this is for non-urgent requests.  _______________________________________________________   Thank you for entrusting me with your care and for choosing [facility], Dr. Estefana Kidney

## 2023-06-05 NOTE — Progress Notes (Signed)
 06/05/2023 Melissa Nicholson [id number] Mar 30, 1991   Chief Complaint: Crohn's follow up  History of Present Illness: Melissa Nicholson is a 33 year old female with a past medical history of anal fissures s/p chemical sphincterotomy with injection of perianal Botox  on 06/21/2020 then subsequently diagnosed with colonic Crohn's disease with perianal fistula per image studies and colonoscopy 08/2020. She was started on Humira  12/06/2020.  Patient continues to have chronic scant amount of brown drainage from her anorectal fistula. Her most recent colonoscopy 07/2022 showed her Crohn's disease was in clinical remission and a superficial perianal fistula was stable.   Interval History: She has been feeling a lot better since her last clinic visit. She has fecal leakage on occasion, which is chronic for her and she has gotten used to this over time. Denies diarrhea. Endorses 1-2 BMs per day, which is normal for her. Will have occasional rectal bleeding.  She is currently taking her Humira  is every 2 weeks. She decided hold off on azathioprine because her symptoms improved. She had a mild localized skin reaction to her injection of Humira  recently.  Denies any signs of anaphylaxis or systemic allergic reaction.  Denies shortness of breath.  Denies joint pains, vision changes, or other skins rashes. Patient is going to an all inclusive resort [place] in 08/2022 for her 10 year anniversary and is looking forward to this     Latest Ref Rng & Units 05/30/2023   12:43 PM 01/20/2023   12:44 PM 09/09/2022   10:30 AM  CBC  WBC 4.0 - 10.5 K/uL 7.8  7.2  7.4   Hemoglobin 12.0 - 15.0 g/dL 85.4  86.3  86.5   Hematocrit 36.0 - 46.0 % 43.7  41.5  39.5   Platelets 150 - 400 K/uL 199  199.0  204.0        Latest Ref Rng & Units 05/30/2023   12:43 PM 01/20/2023   12:44 PM 03/12/2022    9:26 AM  CMP  Glucose 70 - 99 mg/dL 892  859    BUN 6 - 20 mg/dL 12  11    Creatinine 9.55 - 1.00 mg/dL 9.12  9.16    Sodium 864 -  145 mmol/L 138  135    Potassium 3.5 - 5.1 mmol/L 3.9  3.6    Chloride 98 - 111 mmol/L 101  103    CO2 22 - 32 mmol/L 24  25    Calcium 8.9 - 10.3 mg/dL 9.4  8.9    Total Protein 6.0 - 8.3 g/dL  7.6  7.5   Total Bilirubin 0.2 - 1.2 mg/dL  0.4  0.4   Alkaline Phos 39 - 117 U/L  38  53   AST 0 - 37 U/L  12  16   ALT 0 - 35 U/L  14  11     Labs 03/2023: Humira  level is 9.5 and antibody level is negative.  Labs 05/2023: Fecal calprotectin is normal at 39.  PAST GI PROCEDURES: Colonoscopy 09/19/20  -Inflammation throughout the entire colon except for the distal sigmoid and rectum.  Biopsies of the right colon showed ulcer with moderate active colitis, transverse colon with focal active colitis, left colon and rectum without significant pathologic findings.  The TI was normal.  The pathology report was consistent with IBD.  Colonoscopy 07/11/2022: - Superficial perianal fistula.  - The entire examined colon is normal. Biopsied.  - The examined portion of the ileum was normal. Biopsied.  - The  examination was otherwise normal on direct and retroflexion views 1. Surgical [P], small bowel, terminal ileum BENIGN ILEAL MUCOSA WITH NO DIAGNOSTIC ABNORMALITY 2. Surgical [P], right colon bxs FOCAL ACTIVE COLITIS/CRYPTITIS NEGATIVE FOR DYSPLASIA AND GRANULOMAS 3. Surgical [P], colon, transverse BENIGN COLONIC MUCOSA WITH NO DIAGNOSTIC ABNORMALITY 4. Surgical [P], left colon BENIGN COLONIC MUCOSA WITH NO DIAGNOSTIC ABNORMALITY 5. Surgical [P], colon, rectum BENIGN COLONIC MUCOSA WITH NO DIAGNOSTIC ABNORMALITY  IMAGE STUDIES: CT enterography 09/11/20 - fairly marked wall thickening to the distal and terminal ileum suggestive of active Crohn's disease  Current Outpatient Medications on File Prior to Visit  Medication Sig Dispense Refill   adalimumab  (HUMIRA , 2 PEN,) 40 MG/0.4ML pen Inject 0.4 mLs (40 mg total) into the skin every 7 (seven) days. (Patient taking differently: Inject 40 mg into the  skin every 14 (fourteen) days.) 4 each 1   calcium carbonate (TITRALAC) 420 MG CHEW chewable tablet Chew 420 mg by mouth.     citalopram (CELEXA) 10 MG tablet Take 10 mg by mouth daily.     ELDERBERRY PO Take 1 tablet by mouth daily.     LO LOESTRIN FE 1 MG-10 MCG / 10 MCG tablet Take 1 tablet by mouth daily.     Omega-3 Fatty Acids (FISH OIL) 1000 MG CAPS Take 1 capsule by mouth daily.     OVER THE COUNTER MEDICATION Probiotic gummy- one daily     Oyster Shell Calcium 500 MG TABS Take 1 tablet by mouth daily.     pantoprazole  (PROTONIX ) 20 MG tablet Take 1 tablet (20 mg total) by mouth daily. 30 tablet 0   Prenatal Vit-Fe Fumarate-FA (PRENATAL PO) Take 1 tablet by mouth daily.     No current facility-administered medications on file prior to visit.   Allergies  Allergen Reactions   Crab (Diagnostic)     Swelling of eyes, she thinks it was a seafood mix   Current Medications, Allergies, Past Medical History, Past Surgical History, Family History and Social History were reviewed in [employer] record.  Physical Exam: BP 110/70 (BP Location: Left Arm, Patient Position: Sitting, Cuff Size: Normal)   Pulse 72   Ht 5' 4 (1.626 m)   Wt 137 lb 4 oz (62.3 kg)   LMP 05/22/2023   BMI 23.56 kg/m  General: Female in no acute distress. Head: Normocephalic and atraumatic. Eyes: No scleral icterus. Conjunctiva pink . Ears: Normal auditory acuity. Mouth: Dentition intact. No ulcers or lesions.  Lungs: Clear throughout to auscultation. Heart: Regular rate and rhythm, no murmur. Abdomen: Soft, nontender and nondistended. No masses or hepatomegaly. Normal bowel sounds x 4 quadrants.  Musculoskeletal: Symmetrical with no gross deformities. Extremities: No edema. Neurological: Alert oriented x 4. No focal deficits.  Psychological: Alert and cooperative. 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 Patient with a scant amount of chronic brown fistula drainage and open posterior anal fissure.  Patient recently has had improvement in her Crohn's symptoms.  Her flare self resolved without having to adjust her Humira  frequency or adding on azathioprine.  Her last fecal calprotectin was normal.  Her last Humira  level was appropriate.  Patient last had her routine labs checked in 05/2023, which were reassuring.  We plan to do lab checks every 3 months and clinic visits every 6 months for follow-up. - Continue Humira  every 2 weeks - Will plan for  CBC, CMP, CRP, vitamin D , vit B12, folate, fecal calprotectin in 08/2023 -Return to clinic in 6 months  I spent 32 minutes of time, including in depth chart review, independent review of results as outlined above, communicating results with the patient directly, face-to-face time with the patient, coordinating care, and ordering studies and medications as appropriate, and documentation.

## 2023-06-24 DIAGNOSIS — J40 Bronchitis, not specified as acute or chronic: Secondary | ICD-10-CM | POA: Diagnosis not present

## 2023-07-04 DIAGNOSIS — J208 Acute bronchitis due to other specified organisms: Secondary | ICD-10-CM | POA: Diagnosis not present

## 2023-07-06 DIAGNOSIS — J208 Acute bronchitis due to other specified organisms: Secondary | ICD-10-CM | POA: Diagnosis not present

## 2023-07-13 ENCOUNTER — Other Ambulatory Visit: Payer: Self-pay | Admitting: Nurse Practitioner

## 2023-07-13 DIAGNOSIS — K50119 Crohn's disease of large intestine with unspecified complications: Secondary | ICD-10-CM

## 2023-07-13 DIAGNOSIS — K602 Anal fissure, unspecified: Secondary | ICD-10-CM

## 2023-07-14 DIAGNOSIS — J069 Acute upper respiratory infection, unspecified: Secondary | ICD-10-CM | POA: Diagnosis not present

## 2023-07-17 ENCOUNTER — Telehealth: Payer: Self-pay

## 2023-07-17 NOTE — Telephone Encounter (Signed)
Received fax from [facility] for clarification request for Humira. Form faxed back to number provided with NP signature.

## 2023-08-01 DIAGNOSIS — J069 Acute upper respiratory infection, unspecified: Secondary | ICD-10-CM | POA: Diagnosis not present

## 2023-08-05 DIAGNOSIS — J069 Acute upper respiratory infection, unspecified: Secondary | ICD-10-CM | POA: Diagnosis not present

## 2023-08-17 ENCOUNTER — Encounter: Payer: Self-pay | Admitting: Internal Medicine

## 2023-08-17 ENCOUNTER — Other Ambulatory Visit (INDEPENDENT_AMBULATORY_CARE_PROVIDER_SITE_OTHER)

## 2023-08-17 DIAGNOSIS — K603 Anal fistula, unspecified: Secondary | ICD-10-CM

## 2023-08-17 DIAGNOSIS — K50119 Crohn's disease of large intestine with unspecified complications: Secondary | ICD-10-CM

## 2023-08-17 DIAGNOSIS — K602 Anal fissure, unspecified: Secondary | ICD-10-CM | POA: Diagnosis not present

## 2023-08-17 LAB — COMPREHENSIVE METABOLIC PANEL
ALT: 16 U/L (ref 0–35)
AST: 12 U/L (ref 0–37)
Albumin: 4.3 g/dL (ref 3.5–5.2)
Alkaline Phosphatase: 40 U/L (ref 39–117)
BUN: 8 mg/dL (ref 6–23)
CO2: 25 meq/L (ref 19–32)
Calcium: 9.2 mg/dL (ref 8.4–10.5)
Chloride: 104 meq/L (ref 96–112)
Creatinine, Ser: 0.79 mg/dL (ref 0.40–1.20)
GFR: 97.23 mL/min (ref >60.00)
Glucose, Bld: 113 mg/dL — ABNORMAL HIGH (ref 70–99)
Potassium: 3.9 meq/L (ref 3.5–5.1)
Sodium: 137 meq/L (ref 135–145)
Total Bilirubin: 0.5 mg/dL (ref 0.2–1.2)
Total Protein: 7.4 g/dL (ref 6.0–8.3)

## 2023-08-17 LAB — CBC WITH DIFFERENTIAL/PLATELET
Basophils Absolute: 0 10*3/uL (ref 0.0–0.1)
Basophils Relative: 0.5 % (ref 0.0–3.0)
Eosinophils Absolute: 0 10*3/uL (ref 0.0–0.7)
Eosinophils Relative: 0.4 % (ref 0.0–5.0)
HCT: 40.1 % (ref 36.0–46.0)
Hemoglobin: 13.4 g/dL (ref 12.0–15.0)
Lymphocytes Relative: 24.6 % (ref 12.0–46.0)
Lymphs Abs: 2.3 10*3/uL (ref 0.7–4.0)
MCHC: 33.4 g/dL (ref 30.0–36.0)
MCV: 91.9 fl (ref 78.0–100.0)
Monocytes Absolute: 0.4 10*3/uL (ref 0.1–1.0)
Monocytes Relative: 4.1 % (ref 3.0–12.0)
Neutro Abs: 6.6 10*3/uL (ref 1.4–7.7)
Neutrophils Relative %: 70.4 % (ref 43.0–77.0)
Platelets: 230 10*3/uL (ref 150.0–400.0)
RBC: 4.36 Mil/uL (ref 3.87–5.11)
RDW: 12.3 % (ref 11.5–15.5)
WBC: 9.4 10*3/uL (ref 4.0–10.5)

## 2023-08-17 LAB — VITAMIN B12: Vitamin B-12: 456 pg/mL (ref 211–911)

## 2023-08-17 LAB — FOLATE: Folate: >25.2 ng/mL (ref >5.9)

## 2023-08-17 LAB — C-REACTIVE PROTEIN: CRP: <1 mg/dL (ref 0.5–20.0)

## 2023-08-17 LAB — VITAMIN D 25 HYDROXY (VIT D DEFICIENCY, FRACTURES): VITD: 33.34 ng/mL (ref 30.00–100.00)

## 2023-08-20 ENCOUNTER — Other Ambulatory Visit

## 2023-08-20 DIAGNOSIS — K50119 Crohn's disease of large intestine with unspecified complications: Secondary | ICD-10-CM

## 2023-08-20 DIAGNOSIS — K602 Anal fissure, unspecified: Secondary | ICD-10-CM | POA: Diagnosis not present

## 2023-08-20 DIAGNOSIS — K603 Anal fistula, unspecified: Secondary | ICD-10-CM | POA: Diagnosis not present

## 2023-08-23 ENCOUNTER — Encounter: Payer: Self-pay | Admitting: Internal Medicine

## 2023-08-23 LAB — CALPROTECTIN, FECAL: Calprotectin, Fecal: 72 ug/g (ref 0–120)

## 2023-08-31 DIAGNOSIS — R590 Localized enlarged lymph nodes: Secondary | ICD-10-CM | POA: Diagnosis not present

## 2023-09-07 ENCOUNTER — Telehealth: Payer: Self-pay | Admitting: Internal Medicine

## 2023-09-07 NOTE — Telephone Encounter (Signed)
 Patient called and made an appointment for July the 8 th and would like to have labs order for June. Patient is requesting a call back.Please advise.

## 2023-09-08 NOTE — Telephone Encounter (Signed)
 Left message for pt to call back

## 2023-09-09 ENCOUNTER — Other Ambulatory Visit: Payer: Self-pay

## 2023-09-09 DIAGNOSIS — K50119 Crohn's disease of large intestine with unspecified complications: Secondary | ICD-10-CM

## 2023-09-09 DIAGNOSIS — Z79620 Long term (current) use of immunosuppressive biologic: Secondary | ICD-10-CM

## 2023-09-09 NOTE — Telephone Encounter (Signed)
 Pt stated that she typically has her labs checked quarterly. Former pt of Dr. Orvan Falconer.  Pt currently scheduled for July 8 to see Dr. Leonides Schanz. Chart was reviewed and noted that pt had been getting Fecal Cal protectin and CBC checked. Please review and advise.

## 2023-09-09 NOTE — Telephone Encounter (Signed)
 Orders for labs placed in Epic. Pt made aware to come around June 10th. Pt verbalized understanding with all questions answered.

## 2023-10-28 ENCOUNTER — Telehealth: Payer: Self-pay | Admitting: Internal Medicine

## 2023-10-28 NOTE — Telephone Encounter (Signed)
 Inbound call from patient stating her fistula has been sensitive. States she recently returned from [place] of [place] country and would like to make sure that there is not an infection. Requesting call back. Please advise, thank you.

## 2023-10-28 NOTE — Telephone Encounter (Signed)
 Pt stated that she was out of the country for 5 days recently and starting yesterday pt stated that she feels that her Fistula is more sensitive and requested that pt be set up for an office visit. Pt was scheduled to see Dr. Rosaline Coma on 10/29/2023 at 1:30 PM. Pt made aware.  Pt verbalized understanding with all questions answered.

## 2023-10-29 ENCOUNTER — Ambulatory Visit: Admitting: Internal Medicine

## 2023-10-29 ENCOUNTER — Encounter: Payer: Self-pay | Admitting: Internal Medicine

## 2023-10-29 ENCOUNTER — Other Ambulatory Visit: Payer: Self-pay

## 2023-10-29 ENCOUNTER — Other Ambulatory Visit (INDEPENDENT_AMBULATORY_CARE_PROVIDER_SITE_OTHER)

## 2023-10-29 VITALS — BP 118/62 | HR 80 | Ht 64.0 in | Wt 123.0 lb

## 2023-10-29 DIAGNOSIS — Z79620 Long term (current) use of immunosuppressive biologic: Secondary | ICD-10-CM

## 2023-10-29 DIAGNOSIS — Z7960 Long term (current) use of unspecified immunomodulators and immunosuppressants: Secondary | ICD-10-CM | POA: Diagnosis not present

## 2023-10-29 DIAGNOSIS — K50119 Crohn's disease of large intestine with unspecified complications: Secondary | ICD-10-CM

## 2023-10-29 DIAGNOSIS — K603 Anal fistula, unspecified: Secondary | ICD-10-CM

## 2023-10-29 DIAGNOSIS — K602 Anal fissure, unspecified: Secondary | ICD-10-CM

## 2023-10-29 DIAGNOSIS — K625 Hemorrhage of anus and rectum: Secondary | ICD-10-CM

## 2023-10-29 DIAGNOSIS — K5090 Crohn's disease, unspecified, without complications: Secondary | ICD-10-CM | POA: Diagnosis not present

## 2023-10-29 DIAGNOSIS — R197 Diarrhea, unspecified: Secondary | ICD-10-CM

## 2023-10-29 LAB — COMPREHENSIVE METABOLIC PANEL WITH GFR
ALT: 11 U/L (ref 0–35)
AST: 11 U/L (ref 0–37)
Albumin: 4.3 g/dL (ref 3.5–5.2)
Alkaline Phosphatase: 39 U/L (ref 39–117)
BUN: 9 mg/dL (ref 6–23)
CO2: 30 meq/L (ref 19–32)
Calcium: 9.4 mg/dL (ref 8.4–10.5)
Chloride: 101 meq/L (ref 96–112)
Creatinine, Ser: 0.9 mg/dL (ref 0.40–1.20)
GFR: 83.03 mL/min (ref >60.00)
Glucose, Bld: 89 mg/dL (ref 70–99)
Potassium: 3.7 meq/L (ref 3.5–5.1)
Sodium: 139 meq/L (ref 135–145)
Total Bilirubin: 0.4 mg/dL (ref 0.2–1.2)
Total Protein: 7.4 g/dL (ref 6.0–8.3)

## 2023-10-29 LAB — HIGH SENSITIVITY CRP: CRP, High Sensitivity: 3.31 mg/L (ref 0.000–5.000)

## 2023-10-29 NOTE — Patient Instructions (Addendum)
 Your provider has requested that you go to the basement level for lab work before leaving today. Press "B" on the elevator. The lab is located at the first door on the left as you exit the elevator.  Return in August for labs only.   Follow up in 6 months   If your blood pressure at your visit was 140/90 or greater, please contact your primary care physician to follow up on this.  _______________________________________________________  If you are age 35 or older, your body mass index should be between 23-30. Your Body mass index is 21.11 kg/m. If this is out of the aforementioned range listed, please consider follow up with your Primary Care Provider.  If you are age 78 or younger, your body mass index should be between 19-25. Your Body mass index is 21.11 kg/m. If this is out of the aformentioned range listed, please consider follow up with your Primary Care Provider.   ________________________________________________________  The [facility] providers would like to encourage you to use MYCHART to communicate with providers for non-urgent requests or questions.  Due to long hold times on the telephone, sending your provider a message by [facility] may be a faster and more efficient way to get a response.  Please allow 48 business hours for a response.  Please remember that this is for non-urgent requests.  _______________________________________________________   Due to recent changes in healthcare laws, you may see the results of your imaging and laboratory studies on MyChart before your provider has had a chance to review them.  We understand that in some cases there may be results that are confusing or concerning to you. Not all laboratory results come back in the same time frame and the provider may be waiting for multiple results in order to interpret others.  Please give us  48 hours in order for your provider to thoroughly review all the results before contacting the office for clarification  of your results.    Thank you for entrusting me with your care and for choosing [facility], Dr. Regino Caprio

## 2023-10-29 NOTE — Progress Notes (Signed)
 10/29/2023 Melissa Nicholson [id number] August 22, 1990   Chief Complaint: Crohn's follow up  History of Present Illness: Melissa Nicholson is a 33 year old female with a past medical history of anal fissures s/p chemical sphincterotomy with injection of perianal Botox  on 06/21/2020 then subsequently diagnosed with colonic Crohn's disease with perianal fistula per image studies and colonoscopy 08/2020. She was started on Humira  12/06/2020.  Her most recent colonoscopy 07/2022 showed her Crohn's disease was in clinical remission and a superficial perianal fistula was stable.   Interval History: She went to a trip to [place] recently and after the trip she has been more sensitive in the perianal area, though this has improved today. She is still having 1-2 BMs per day. Denies fevers. Stools have been slightly looser than usual, but diet was varied during her recent trip. Denies increased rectal drainage. Denies abdominal pain. She has been taking her Humira  as prescribed. Her next dose of Humira  is due this upcoming Sunday.     Latest Ref Rng & Units 08/17/2023   11:21 AM 05/30/2023   12:43 PM 01/20/2023   12:44 PM  CBC  WBC 4.0 - 10.5 K/uL 9.4  7.8  7.2   Hemoglobin 12.0 - 15.0 g/dL 10.2  72.5  36.6   Hematocrit 36.0 - 46.0 % 40.1  43.7  41.5   Platelets 150.0 - 400.0 K/uL 230.0  199  199.0        Latest Ref Rng & Units 08/17/2023   11:21 AM 05/30/2023   12:43 PM 01/20/2023   12:44 PM  CMP  Glucose 70 - 99 mg/dL 440  347  425   BUN 6 - 23 mg/dL 8  12  11    Creatinine 0.40 - 1.20 mg/dL 9.56  3.87  5.64   Sodium 135 - 145 mEq/L 137  138  135   Potassium 3.5 - 5.1 mEq/L 3.9  3.9  3.6   Chloride 96 - 112 mEq/L 104  101  103   CO2 19 - 32 mEq/L 25  24  25    Calcium 8.4 - 10.5 mg/dL 9.2  9.4  8.9   Total Protein 6.0 - 8.3 g/dL 7.4   7.6   Total Bilirubin 0.2 - 1.2 mg/dL 0.5   0.4   Alkaline Phos 39 - 117 U/L 40   38   AST 0 - 37 U/L 12   12   ALT 0 - 35 U/L 16   14     Labs 03/2023:  Humira  level is 9.5 and antibody level is negative.  Labs 05/2023: Fecal calprotectin is normal at 39.  PAST GI PROCEDURES: Colonoscopy 09/19/20  -Inflammation throughout the entire colon except for the distal sigmoid and rectum.  Biopsies of the right colon showed ulcer with moderate active colitis, transverse colon with focal active colitis, left colon and rectum without significant pathologic findings.  The TI was normal.  The pathology report was consistent with IBD.  Colonoscopy 07/11/2022: - Superficial perianal fistula.  - The entire examined colon is normal. Biopsied.  - The examined portion of the ileum was normal. Biopsied.  - The examination was otherwise normal on direct and retroflexion views 1. Surgical [P], small bowel, terminal ileum BENIGN ILEAL MUCOSA WITH NO DIAGNOSTIC ABNORMALITY 2. Surgical [P], right colon bxs FOCAL ACTIVE COLITIS/CRYPTITIS NEGATIVE FOR DYSPLASIA AND GRANULOMAS 3. Surgical [P], colon, transverse BENIGN COLONIC MUCOSA WITH NO DIAGNOSTIC ABNORMALITY 4. Surgical [P], left colon BENIGN COLONIC MUCOSA WITH NO DIAGNOSTIC ABNORMALITY  5. Surgical [P], colon, rectum BENIGN COLONIC MUCOSA WITH NO DIAGNOSTIC ABNORMALITY  IMAGE STUDIES: CT enterography 09/11/20 - fairly marked wall thickening to the distal and terminal ileum suggestive of active Crohn's disease  Current Outpatient Medications on File Prior to Visit  Medication Sig Dispense Refill   adalimumab  (HUMIRA , 2 PEN,) 40 MG/0.4ML pen Inject 0.4 mLs (40 mg total) into the skin every 14 (fourteen) days. 6 each 1   calcium carbonate (TITRALAC) 420 MG CHEW chewable tablet Chew 420 mg by mouth. Pt states its a capsule not chewable.     citalopram (CELEXA) 10 MG tablet Take 10 mg by mouth daily.     ELDERBERRY PO Take 1 tablet by mouth daily.     LO LOESTRIN FE 1 MG-10 MCG / 10 MCG tablet Take 1 tablet by mouth daily.     Omega-3 Fatty Acids (FISH OIL) 1000 MG CAPS Take 1 capsule by mouth daily.     OVER  THE COUNTER MEDICATION Probiotic gummy- one daily     pantoprazole  (PROTONIX ) 20 MG tablet Take 1 tablet (20 mg total) by mouth daily. 30 tablet 0   Prenatal Vit-Fe Fumarate-FA (PRENATAL PO) Take 1 tablet by mouth daily.     Oyster Shell Calcium 500 MG TABS Take 1 tablet by mouth daily. (Patient not taking: Reported on 10/29/2023)     No current facility-administered medications on file prior to visit.   Allergies  Allergen Reactions   Crab (Diagnostic)     Swelling of eyes, she thinks it was a seafood mix   Current Medications, Allergies, Past Medical History, Past Surgical History, Family History and Social History were reviewed in [employer] record.  Physical Exam: BP 118/62   Pulse 80   Ht 5\' 4"  (1.626 m)   Wt 123 lb (55.8 kg)   BMI 21.11 kg/m  General: Female in no acute distress. Head: Normocephalic and atraumatic. Eyes: No scleral icterus. Conjunctiva pink . Ears: Normal auditory acuity. Mouth: Dentition intact. No ulcers or lesions.  Lungs: Clear throughout to auscultation. Heart: Regular rate and rhythm, no murmur. Abdomen: Soft, nontender and nondistended. No masses or hepatomegaly. Normal bowel sounds x 4 quadrants.  Rectal: Chronic perianal fissure. No signs of purulent drainage or bleeding. Musculoskeletal: Symmetrical with no gross deformities. Extremities: No edema. Neurological: Alert oriented x 4. No focal deficits.  Psychological: Alert and cooperative. Normal mood and affect   Assessment and Recommendations:  33 year old female with colon and anal Crohn's disease involving a chronic posterior anal fissure and fistula. Patient's fissure appears to be non-tender and was not actively draining any fluid on today's rectal exam. She was more sensitive in the rectal area after her trip to [place] recently, but this has improved over time. She has ben good about taking her Humira . Her last fecal calprotectin and inflammatory markers looked good.  Will check her labs again today and continue routine follow up.  - Continue Humira  every 2 weeks - Check CBC, CMP, CRP, Humira  level and antibody, quant gold - Check fecal calprotectin - Check CBC, CMP, CRP in 3 months - Return to clinic in 6 months  I spent 31 minutes of time, including in depth chart review, independent review of results as outlined above, communicating results with the patient directly, face-to-face time with the patient, coordinating care, and ordering studies and medications as appropriate, and documentation.

## 2023-11-01 LAB — QUANTIFERON-TB GOLD PLUS
Mitogen-NIL: 9.28 [IU]/mL
NIL: 0.02 [IU]/mL
QuantiFERON-TB Gold Plus: NEGATIVE
TB1-NIL: 0.01 [IU]/mL
TB2-NIL: 0.02 [IU]/mL

## 2023-11-02 ENCOUNTER — Ambulatory Visit: Payer: Self-pay | Admitting: Internal Medicine

## 2023-11-03 ENCOUNTER — Other Ambulatory Visit

## 2023-11-03 DIAGNOSIS — K50119 Crohn's disease of large intestine with unspecified complications: Secondary | ICD-10-CM | POA: Diagnosis not present

## 2023-11-03 DIAGNOSIS — K603 Anal fistula, unspecified: Secondary | ICD-10-CM | POA: Diagnosis not present

## 2023-11-03 DIAGNOSIS — Z79620 Long term (current) use of immunosuppressive biologic: Secondary | ICD-10-CM

## 2023-11-03 DIAGNOSIS — K602 Anal fissure, unspecified: Secondary | ICD-10-CM | POA: Diagnosis not present

## 2023-11-05 ENCOUNTER — Other Ambulatory Visit: Payer: Self-pay

## 2023-11-05 DIAGNOSIS — Z79620 Long term (current) use of immunosuppressive biologic: Secondary | ICD-10-CM

## 2023-11-05 DIAGNOSIS — K50119 Crohn's disease of large intestine with unspecified complications: Secondary | ICD-10-CM

## 2023-11-05 DIAGNOSIS — K625 Hemorrhage of anus and rectum: Secondary | ICD-10-CM

## 2023-11-05 DIAGNOSIS — R197 Diarrhea, unspecified: Secondary | ICD-10-CM

## 2023-11-05 LAB — CALPROTECTIN, FECAL: Calprotectin, Fecal: 285 ug/g — ABNORMAL HIGH (ref 0–120)

## 2023-11-07 LAB — SERIAL MONITORING

## 2023-11-09 LAB — ADALIMUMAB+AB (SERIAL MONITOR)
Adalimumab Drug Level: 7.8 ug/mL
Anti-Adalimumab Antibody: <25 ng/mL

## 2023-12-08 ENCOUNTER — Ambulatory Visit: Admitting: Internal Medicine

## 2024-01-07 DIAGNOSIS — H1045 Other chronic allergic conjunctivitis: Secondary | ICD-10-CM | POA: Diagnosis not present

## 2024-01-25 ENCOUNTER — Other Ambulatory Visit (INDEPENDENT_AMBULATORY_CARE_PROVIDER_SITE_OTHER)

## 2024-01-25 DIAGNOSIS — K625 Hemorrhage of anus and rectum: Secondary | ICD-10-CM | POA: Diagnosis not present

## 2024-01-25 DIAGNOSIS — K50119 Crohn's disease of large intestine with unspecified complications: Secondary | ICD-10-CM | POA: Diagnosis not present

## 2024-01-25 DIAGNOSIS — R197 Diarrhea, unspecified: Secondary | ICD-10-CM | POA: Diagnosis not present

## 2024-01-25 LAB — CBC
HCT: 40.4 % (ref 36.0–46.0)
Hemoglobin: 13.7 g/dL (ref 12.0–15.0)
MCHC: 34 g/dL (ref 30.0–36.0)
MCV: 91.7 fl (ref 78.0–100.0)
Platelets: 190 K/uL (ref 150.0–400.0)
RBC: 4.4 Mil/uL (ref 3.87–5.11)
RDW: 12.5 % (ref 11.5–15.5)
WBC: 7.1 K/uL (ref 4.0–10.5)

## 2024-01-25 LAB — COMPREHENSIVE METABOLIC PANEL WITH GFR
ALT: 11 U/L (ref 0–35)
AST: 11 U/L (ref 0–37)
Albumin: 4.2 g/dL (ref 3.5–5.2)
Alkaline Phosphatase: 32 U/L — ABNORMAL LOW (ref 39–117)
BUN: 7 mg/dL (ref 6–23)
CO2: 28 meq/L (ref 19–32)
Calcium: 8.8 mg/dL (ref 8.4–10.5)
Chloride: 103 meq/L (ref 96–112)
Creatinine, Ser: 0.88 mg/dL (ref 0.40–1.20)
GFR: 85.16 mL/min (ref >60.00)
Glucose, Bld: 116 mg/dL — ABNORMAL HIGH (ref 70–99)
Potassium: 3.9 meq/L (ref 3.5–5.1)
Sodium: 139 meq/L (ref 135–145)
Total Bilirubin: 0.6 mg/dL (ref 0.2–1.2)
Total Protein: 7.5 g/dL (ref 6.0–8.3)

## 2024-01-25 LAB — HIGH SENSITIVITY CRP: CRP, High Sensitivity: 2.3 mg/L (ref 0.000–5.000)

## 2024-01-26 ENCOUNTER — Other Ambulatory Visit: Payer: Self-pay | Admitting: Nurse Practitioner

## 2024-01-26 ENCOUNTER — Ambulatory Visit: Payer: Self-pay | Admitting: Internal Medicine

## 2024-01-26 DIAGNOSIS — K603 Anal fistula, unspecified: Secondary | ICD-10-CM

## 2024-01-26 DIAGNOSIS — K50119 Crohn's disease of large intestine with unspecified complications: Secondary | ICD-10-CM

## 2024-01-27 ENCOUNTER — Other Ambulatory Visit (HOSPITAL_COMMUNITY): Payer: Self-pay

## 2024-01-27 ENCOUNTER — Telehealth: Payer: Self-pay

## 2024-01-27 NOTE — Telephone Encounter (Signed)
 PA request has been Submitted. New Encounter has been or will be created for follow up. For additional info see Pharmacy Prior Auth telephone encounter from 01-27-2024.

## 2024-01-27 NOTE — Telephone Encounter (Signed)
 Pharmacy Patient Advocate Encounter   Received notification from RX Request Messages that prior authorization for Humira  (2 Pen) (CF) 40MG /0.4ML auto-injector kit is required/requested.   Insurance verification completed.   The patient is insured through [facility] .   Per test claim: PA required; PA submitted to above mentioned insurance via Latent Key/confirmation #/EOC [id number] Status is pending

## 2024-01-27 NOTE — Telephone Encounter (Signed)
 Please submit PA for Humira . Thanks

## 2024-01-28 ENCOUNTER — Other Ambulatory Visit

## 2024-01-28 DIAGNOSIS — K625 Hemorrhage of anus and rectum: Secondary | ICD-10-CM

## 2024-01-28 DIAGNOSIS — K50119 Crohn's disease of large intestine with unspecified complications: Secondary | ICD-10-CM

## 2024-01-28 NOTE — Telephone Encounter (Signed)
 Pharmacy Patient Advocate Encounter  Received notification from [facility] that Prior Authorization for Humira  (2 Pen) (CF) 40MG /0.4ML auto-injector kit has been CANCELLED due to authorization on file effective until 04-15-2024   PA #/Case ID/Reference #: [id number]

## 2024-01-30 LAB — CALPROTECTIN, FECAL: Calprotectin, Fecal: 48 ug/g (ref 0–120)

## 2024-02-02 ENCOUNTER — Ambulatory Visit: Payer: Self-pay | Admitting: Internal Medicine

## 2024-02-17 DIAGNOSIS — Z23 Encounter for immunization: Secondary | ICD-10-CM | POA: Diagnosis not present

## 2024-03-29 DIAGNOSIS — Z01419 Encounter for gynecological examination (general) (routine) without abnormal findings: Secondary | ICD-10-CM | POA: Diagnosis not present

## 2024-03-29 DIAGNOSIS — Z6821 Body mass index (BMI) 21.0-21.9, adult: Secondary | ICD-10-CM | POA: Diagnosis not present

## 2024-03-31 DIAGNOSIS — R002 Palpitations: Secondary | ICD-10-CM | POA: Diagnosis not present

## 2024-04-08 DIAGNOSIS — R002 Palpitations: Secondary | ICD-10-CM | POA: Diagnosis not present

## 2024-04-19 DIAGNOSIS — R002 Palpitations: Secondary | ICD-10-CM | POA: Diagnosis not present

## 2024-04-25 ENCOUNTER — Encounter: Payer: Self-pay | Admitting: Gastroenterology

## 2024-04-25 ENCOUNTER — Ambulatory Visit: Admitting: Gastroenterology

## 2024-04-25 VITALS — BP 104/64 | HR 71 | Ht 64.0 in | Wt 124.0 lb

## 2024-04-25 DIAGNOSIS — Z79620 Long term (current) use of immunosuppressive biologic: Secondary | ICD-10-CM

## 2024-04-25 DIAGNOSIS — K50119 Crohn's disease of large intestine with unspecified complications: Secondary | ICD-10-CM | POA: Diagnosis not present

## 2024-04-25 NOTE — Addendum Note (Signed)
 Addended by: SUELLEN PEERS on: 04/25/2024 12:20 PM   Modules accepted: Orders

## 2024-04-25 NOTE — Patient Instructions (Addendum)
 Your provider has requested that you go to the basement level for lab work next 05/03/24 .Press B on the elevator. The lab is located at the first door on the left as you exit the elevator.  Due to recent changes in healthcare laws, you may see the results of your imaging and laboratory studies on MyChart before your provider has had a chance to review them.  We understand that in some cases there may be results that are confusing or concerning to you. Not all laboratory results come back in the same time frame and the provider may be waiting for multiple results in order to interpret others.  Please give us  48 hours in order for your provider to thoroughly review all the results before contacting the office for clarification of your results.   _______________________________________________________  If your blood pressure at your visit was 140/90 or greater, please contact your primary care physician to follow up on this.  _______________________________________________________  If you are age 35 or older, your body mass index should be between 23-30. Your Body mass index is 21.28 kg/m. If this is out of the aforementioned range listed, please consider follow up with your Primary Care Provider.  If you are age 35 or younger, your body mass index should be between 19-25. Your Body mass index is 21.28 kg/m. If this is out of the aformentioned range listed, please consider follow up with your Primary Care Provider.   ________________________________________________________  The [facility] providers would like to encourage you to use MYCHART to communicate with providers for non-urgent requests or questions.  Due to long hold times on the telephone, sending your provider a message by [facility] may be a faster and more efficient way to get a response.  Please allow 48 business hours for a response.  Please remember that this is for non-urgent requests.   _______________________________________________________  [facility] is using a team-based approach to care.  Your team is made up of your doctor and two to three APPS. Our APPS (Nurse Practitioners and Physician Assistants) work with your physician to ensure care continuity for you. They are fully qualified to address your health concerns and develop a treatment plan. They communicate directly with your gastroenterologist to care for you. Seeing the Advanced Practice Practitioners on your physician's team can help you by facilitating care more promptly, often allowing for earlier appointments, access to diagnostic testing, procedures, and other specialty referrals.   Thank you for choosing me and [facility].  Dr.Danis

## 2024-04-25 NOTE — Progress Notes (Signed)
 [facility] Consult Note:  History: Melissa Nicholson 04/25/2024  Referring provider: Laurent Drivers, MD  Reason for consult/chief complaint: Crohn's Disease (Pt states she is doing well today)   Subjective  Prior history:   Melissa Nicholson is a 33 year old female with a past medical history of anal fissures s/p chemical sphincterotomy with injection of perianal Botox  on 06/21/2020 then subsequently diagnosed with colonic Crohn's disease with perianal fistula per image studies and colonoscopy 08/2020. She was started on Humira  12/06/2020.  Her most recent colonoscopy 07/2022 showed her Crohn's disease was in clinical remission and a superficial perianal fistula was stable.   PAST GI PROCEDURES: Colonoscopy 09/19/20  -Inflammation throughout the entire colon except for the distal sigmoid and rectum.  Biopsies of the right colon showed ulcer with moderate active colitis, transverse colon with focal active colitis, left colon and rectum without significant pathologic findings.  The TI was normal.  The pathology report was consistent with IBD.   Colonoscopy 07/11/2022: - Superficial perianal fistula.  - The entire examined colon is normal. Biopsied.  - The examined portion of the ileum was normal. Biopsied.  - The examination was otherwise normal on direct and retroflexion views 1. Surgical [P], small bowel, terminal ileum BENIGN ILEAL MUCOSA WITH NO DIAGNOSTIC ABNORMALITY 2. Surgical [P], right colon bxs FOCAL ACTIVE COLITIS/CRYPTITIS NEGATIVE FOR DYSPLASIA AND GRANULOMAS 3. Surgical [P], colon, transverse BENIGN COLONIC MUCOSA WITH NO DIAGNOSTIC ABNORMALITY 4. Surgical [P], left colon BENIGN COLONIC MUCOSA WITH NO DIAGNOSTIC ABNORMALITY 5. Surgical [P], colon, rectum BENIGN COLONIC MUCOSA WITH NO DIAGNOSTIC ABNORMALITY ______________________________  CT enterography 09/11/20 - fairly marked wall thickening to the distal and terminal ileum suggestive of active Crohn's  disease   Last seen by Dr. Federico May 2025, doing well overall on Humira  monotherapy.  She did experience some self-limited diarrhea and anorectal symptoms after a trip to [place].  No fissure found on exam during that most recent visit.  Discussed the use of AI scribe software for clinical note transcription with the patient, who gave verbal consent to proceed.  History of Present Illness Melissa Nicholson is a 33 year old female with Crohn's disease who presents for follow-up of her condition.  Crohn's disease activity - Crohn's disease managed with Humira  injections every other week - Flares characterized by diarrhea and abdominal cramping, most recently in May following travel to [place]; symptoms resolved without steroid therapy - Digestive symptoms generally stable; bowel movements range from none on some days to up to four times on others - Rectal bleeding occurs once or twice per month - No significant impact on daily activities or work - No difficulty swallowing, nausea, or vomiting - Appetite remains good  Humira  therapy and adverse effects - Humira  administered every other week - Occasional itching and splotchiness at injection site, managed by rotating injection sites on thighs - Humira  levels last checked in May; typically monitored once or twice per year  Fistula and dermatologic symptoms - No symptoms of fistulas, including pain, burning, or drainage (she does not feel that an exam of that area was needed today) - No unusual rashes  Physical activity and functional status - Remains physically active, playing tennis once a week  Recent laboratory and preventive care - Laboratory studies in August were satisfactory - Received flu vaccination this year - Up to date on pneumonia vaccine - Does not plan to receive annual COVID vaccination    ROS:  Review of Systems  Constitutional:  Negative  for appetite change and unexpected weight change.  HENT:  Negative for  mouth sores and voice change.   Eyes:  Negative for pain and redness.  Respiratory:  Negative for cough and shortness of breath.   Cardiovascular:  Negative for chest pain and palpitations.  Genitourinary:  Negative for dysuria and hematuria.  Musculoskeletal:  Negative for arthralgias and myalgias.  Skin:  Negative for pallor and rash.  Neurological:  Negative for weakness and headaches.  Hematological:  Negative for adenopathy.     Past Medical History: Past Medical History:  Diagnosis Date   Acne 2023   isotretinoin    Anxiety    Atypical mole 06/13/2016   lower mid back mild   Chronic anal fissure    COVID 06/2019   sob, chest pain loss of taste and smell fever coughx 1 week all symptoms resolved   COVID-19 11/2020   Crohn's colitis (HCC)    Fistula, anal    Wears glasses      Past Surgical History: Past Surgical History:  Procedure Laterality Date   ANAL FISSURE REPAIR     with botox    BREAST BIOPSY Left 12/05/2022   US  LT BREAST BX W LOC DEV 1ST LESION IMG BX SPEC US  GUIDE 12/05/2022 [facility]   COLONOSCOPY     lumps removed from both breasts Bilateral 2011   fibrous adenomas benign   RECTAL EXAM UNDER ANESTHESIA N/A 06/21/2020   Procedure: ANORECTAL EXAM UNDER ANESTHESIA. INJECTION OF ANAL BOTOX ;  Surgeon: Teresa Lonni HERO, MD;  Location: [facility];  Service: General;  Laterality: N/A;   WISDOM TOOTH EXTRACTION       Family History: Family History  Problem Relation Age of Onset   Heart block Father    Colon cancer Paternal Grandmother    Stomach cancer Neg Hx    Esophageal cancer Neg Hx    Pancreatic cancer Neg Hx    Rectal cancer Neg Hx     Social History: Social History   Socioeconomic History   Marital status: Married    Spouse name: Not on file   Number of children: 0   Years of education: Not on file   Highest education level: Not on file  Occupational History   Occupation: energy manager at [employer]  Tobacco Use   Smoking status: Never   Smokeless tobacco: Never  Vaping Use   Vaping status: Never Used  Substance and Sexual Activity   Alcohol use: Yes    Comment: 1-2 qod   Drug use: Never   Sexual activity: Yes    Birth control/protection: Pill  Other Topics Concern   Not on file  Social History Narrative   Not on file   Social Drivers of Health   Financial Resource Strain: Not on file  Food Insecurity: Not on file  Transportation Needs: Not on file  Physical Activity: Not on file  Stress: Not on file  Social Connections: Not on file    Allergies: Allergies  Allergen Reactions   Crab (Diagnostic)     Swelling of eyes, she thinks it was a seafood mix    Outpatient Meds: Current Outpatient Medications  Medication Sig Dispense Refill   calcium carbonate (TITRALAC) 420 MG CHEW chewable tablet Chew 420 mg by mouth. Pt states its a capsule not chewable.     citalopram (CELEXA) 10 MG tablet Take 10 mg by mouth daily.     ELDERBERRY PO Take 1 tablet by mouth daily.  HUMIRA , 2 PEN, 40 MG/0.4ML pen INJECT 40 MG (0.4 ML) UNDER THE SKIN EVERY 14 DAYS 2 each 5   LO LOESTRIN FE 1 MG-10 MCG / 10 MCG tablet Take 1 tablet by mouth daily.     Omega-3 Fatty Acids (FISH OIL) 1000 MG CAPS Take 1 capsule by mouth daily.     OVER THE COUNTER MEDICATION Probiotic gummy- one daily     Oyster Shell Calcium 500 MG TABS Take 1 tablet by mouth daily.     Prenatal Vit-Fe Fumarate-FA (PRENATAL PO) Take 1 tablet by mouth daily.     pantoprazole  (PROTONIX ) 20 MG tablet Take 1 tablet (20 mg total) by mouth daily. (Patient not taking: Reported on 04/25/2024) 30 tablet 0   No current facility-administered medications for this visit.      ___________________________________________________________________ Objective   Exam:  BP 104/64   Pulse 71   Ht 5' 4 (1.626 m)   Wt 124 lb (56.2 kg)   BMI 21.28 kg/m  Wt Readings from Last 3 Encounters:  04/25/24 124 lb (56.2 kg)   10/29/23 123 lb (55.8 kg)  06/05/23 137 lb 4 oz (62.3 kg)    General: Well-appearing Eyes: sclera anicteric, no redness ENT: oral mucosa moist without lesions, no cervical or supraclavicular lymphadenopathy CV: Regular without appreciable murmur, no JVD, no peripheral edema Resp: clear to auscultation bilaterally, normal RR and effort noted GI: soft, no tenderness, with active bowel sounds. No guarding or palpable organomegaly noted. Skin; warm and dry, no rash or jaundice noted Neuro: awake, alert and oriented x 3. Normal gross motor function and fluent speech Declined perianal exam  Labs:     Latest Ref Rng & Units 01/25/2024   11:42 AM 08/17/2023   11:21 AM 05/30/2023   12:43 PM  CBC  WBC 4.0 - 10.5 K/uL 7.1  9.4  7.8   Hemoglobin 12.0 - 15.0 g/dL 86.2  86.5  85.4   Hematocrit 36.0 - 46.0 % 40.4  40.1  43.7   Platelets 150.0 - 400.0 K/uL 190.0  230.0  199       Latest Ref Rng & Units 01/25/2024   11:42 AM 10/29/2023    2:56 PM 08/17/2023   11:21 AM  CMP  Glucose 70 - 99 mg/dL 883  89  886   BUN 6 - 23 mg/dL 7  9  8    Creatinine 0.40 - 1.20 mg/dL 9.11  9.09  9.20   Sodium 135 - 145 mEq/L 139  139  137   Potassium 3.5 - 5.1 mEq/L 3.9  3.7  3.9   Chloride 96 - 112 mEq/L 103  101  104   CO2 19 - 32 mEq/L 28  30  25    Calcium 8.4 - 10.5 mg/dL 8.8  9.4  9.2   Total Protein 6.0 - 8.3 g/dL 7.5  7.4  7.4   Total Bilirubin 0.2 - 1.2 mg/dL 0.6  0.4  0.5   Alkaline Phos 39 - 117 U/L 32  39  40   AST 0 - 37 U/L 11  11  12    ALT 0 - 35 U/L 11  11  16     Fecal calprotectin was elevated to 285 just after that May 2025 office visit, then decrease to 48 when most recently checked August 2025  High-sensitivity CRP 2.3 (normal)  Last adalimumab  drug antibody 7.8 with no detectable antibodies May 2025  QuantiFERON gold - May 2025     Encounter Diagnoses  Name Primary?  Crohn's disease of colon, unspecified complication (HCC) Yes   Long-term current use of immunosuppressive  biologic agent     Assessment & Plan Crohn's disease of colon Crohn's disease well-controlled with Humira  monotherapy. Occasional brief flares with cramps or loose stool or some perianal burning.  The episode that occurred shortly before her last visit may have been something infectious related to travel.  No significant bowel changes, rectal bleeding, fistula symptoms, or rash.- Continue Humira  injections biweekly. - Ordered Humira  level test for antibody monitoring.  - Ensure calcium supplement includes vitamin D ; consider alternative if absent. - Maintain hand hygiene and avoid sick contacts during respiratory season.  Labs (next week, right before next scheduled Humira  injection): CBC/differential, CMP, CRP, adalimumab  drug antibody level  She has been getting fecal calprotectin quarterly, but she is feeling quite well now and I do not think she needs it at this juncture.   Forward this report to Dr. Federico for when she returns from maternity leave in about 6 weeks.  She can then decide on timing of follow-up lab testing and clinic evaluation   Thank you for the courtesy of this consult.  Please call me with any questions or concerns.  Victory LITTIE Brand III  CC: Referring provider noted above

## 2024-04-27 ENCOUNTER — Telehealth: Payer: Self-pay

## 2024-04-27 NOTE — Telephone Encounter (Signed)
 Pharmacy Patient Advocate Encounter   Received notification from CoverMyMeds that prior authorization for Humira  (2 Pen) (CF) 40MG /0.4ML auto-injector kit is required/requested.   Insurance verification completed.   The patient is insured through [facility].   Per test claim: PA required; PA submitted to above mentioned insurance via Latent Key/confirmation #/EOC [id number] Status is pending

## 2024-05-02 ENCOUNTER — Other Ambulatory Visit (HOSPITAL_COMMUNITY): Payer: Self-pay

## 2024-05-02 NOTE — Telephone Encounter (Signed)
 Pt made aware of the PA approval. Pt verbalized understanding with all questions answered.

## 2024-05-02 NOTE — Telephone Encounter (Signed)
 Pharmacy Patient Advocate Encounter  Received notification from [facility] that Prior Authorization for Humira  (2 Pen) (CF) 40MG /0.4ML auto-injector kit has been APPROVED from 04-27-2024 to 04-27-2025  Must fill at [facility]  PA #/Case ID/Reference #: [id number]

## 2024-05-03 ENCOUNTER — Other Ambulatory Visit (INDEPENDENT_AMBULATORY_CARE_PROVIDER_SITE_OTHER)

## 2024-05-03 DIAGNOSIS — Z79620 Long term (current) use of immunosuppressive biologic: Secondary | ICD-10-CM | POA: Diagnosis not present

## 2024-05-03 DIAGNOSIS — K50119 Crohn's disease of large intestine with unspecified complications: Secondary | ICD-10-CM | POA: Diagnosis not present

## 2024-05-03 LAB — CBC WITH DIFFERENTIAL/PLATELET
Basophils Absolute: 0 K/uL (ref 0.0–0.1)
Basophils Relative: 0.6 % (ref 0.0–3.0)
Eosinophils Absolute: 0 K/uL (ref 0.0–0.7)
Eosinophils Relative: 0.6 % (ref 0.0–5.0)
HCT: 41.8 % (ref 36.0–46.0)
Hemoglobin: 14 g/dL (ref 12.0–15.0)
Lymphocytes Relative: 46.9 % — ABNORMAL HIGH (ref 12.0–46.0)
Lymphs Abs: 2.4 K/uL (ref 0.7–4.0)
MCHC: 33.5 g/dL (ref 30.0–36.0)
MCV: 91.9 fl (ref 78.0–100.0)
Monocytes Absolute: 0.2 K/uL (ref 0.1–1.0)
Monocytes Relative: 4.5 % (ref 3.0–12.0)
Neutro Abs: 2.4 K/uL (ref 1.4–7.7)
Neutrophils Relative %: 47.4 % (ref 43.0–77.0)
Platelets: 166 K/uL (ref 150.0–400.0)
RBC: 4.55 Mil/uL (ref 3.87–5.11)
RDW: 12.4 % (ref 11.5–15.5)
WBC: 5.1 K/uL (ref 4.0–10.5)

## 2024-05-03 LAB — LIPID PANEL
Cholesterol: 208 mg/dL — ABNORMAL HIGH (ref 0–200)
HDL: 57.5 mg/dL (ref >39.00)
LDL Cholesterol: 131 mg/dL — ABNORMAL HIGH (ref 0–99)
NonHDL: 150.56
Total CHOL/HDL Ratio: 4
Triglycerides: 96 mg/dL (ref 0.0–149.0)
VLDL: 19.2 mg/dL (ref 0.0–40.0)

## 2024-05-03 LAB — COMPREHENSIVE METABOLIC PANEL WITH GFR
ALT: 11 U/L (ref 0–35)
AST: 11 U/L (ref 0–37)
Albumin: 4.4 g/dL (ref 3.5–5.2)
Alkaline Phosphatase: 30 U/L — ABNORMAL LOW (ref 39–117)
BUN: 13 mg/dL (ref 6–23)
CO2: 28 meq/L (ref 19–32)
Calcium: 9.2 mg/dL (ref 8.4–10.5)
Chloride: 102 meq/L (ref 96–112)
Creatinine, Ser: 0.92 mg/dL (ref 0.40–1.20)
GFR: 80.58 mL/min (ref >60.00)
Glucose, Bld: 99 mg/dL (ref 70–99)
Potassium: 4 meq/L (ref 3.5–5.1)
Sodium: 137 meq/L (ref 135–145)
Total Bilirubin: 0.9 mg/dL (ref 0.2–1.2)
Total Protein: 7.4 g/dL (ref 6.0–8.3)

## 2024-05-03 LAB — VITAMIN D 25 HYDROXY (VIT D DEFICIENCY, FRACTURES): VITD: 34.76 ng/mL (ref 30.00–100.00)

## 2024-05-03 LAB — HIGH SENSITIVITY CRP: CRP, High Sensitivity: 2.38 mg/L (ref 0.000–5.000)

## 2024-05-08 ENCOUNTER — Ambulatory Visit: Payer: Self-pay | Admitting: Gastroenterology

## 2024-05-12 LAB — ADALIMUMAB+AB (SERIAL MONITOR)
Adalimumab Drug Level: 11 ug/mL
Anti-Adalimumab Antibody: <25 ng/mL

## 2024-05-12 LAB — SERIAL MONITORING

## 2024-07-20 ENCOUNTER — Ambulatory Visit: Admitting: Internal Medicine
